# Patient Record
Sex: Female | Born: 1940 | Race: White | Hispanic: No | Smoking: Never smoker
Health system: Southern US, Community
[De-identification: ages and names within clinical notes are randomized; demographics above are authoritative.]

## PROBLEM LIST (undated history)

## (undated) DIAGNOSIS — G709 Myoneural disorder, unspecified: Secondary | ICD-10-CM

## (undated) DIAGNOSIS — I499 Cardiac arrhythmia, unspecified: Secondary | ICD-10-CM

## (undated) DIAGNOSIS — Z8489 Family history of other specified conditions: Secondary | ICD-10-CM

## (undated) DIAGNOSIS — Z9889 Other specified postprocedural states: Secondary | ICD-10-CM

## (undated) DIAGNOSIS — I10 Essential (primary) hypertension: Secondary | ICD-10-CM

## (undated) DIAGNOSIS — E039 Hypothyroidism, unspecified: Secondary | ICD-10-CM

## (undated) DIAGNOSIS — M1990 Unspecified osteoarthritis, unspecified site: Secondary | ICD-10-CM

## (undated) DIAGNOSIS — R7303 Prediabetes: Secondary | ICD-10-CM

## (undated) DIAGNOSIS — R112 Nausea with vomiting, unspecified: Secondary | ICD-10-CM

## (undated) HISTORY — PX: COLON RESECTION: SHX5231

## (undated) HISTORY — PX: CATARACT EXTRACTION: SUR2

## (undated) HISTORY — PX: OTHER SURGICAL HISTORY: SHX169

## (undated) HISTORY — PX: COLON SURGERY: SHX602

## (undated) HISTORY — PX: TUBAL LIGATION: SHX77

## (undated) HISTORY — PX: THYROIDECTOMY: SHX17

## (undated) HISTORY — PX: TONSILLECTOMY: SUR1361

## (undated) HISTORY — PX: CARPAL TUNNEL RELEASE: SHX101

---

## 2008-10-14 ENCOUNTER — Ambulatory Visit (HOSPITAL_BASED_OUTPATIENT_CLINIC_OR_DEPARTMENT_OTHER): Admission: RE | Admit: 2008-10-14 | Discharge: 2008-10-14 | Payer: Self-pay | Admitting: Orthopedic Surgery

## 2010-04-25 HISTORY — PX: COLON RESECTION: SHX5231

## 2010-08-02 LAB — POCT I-STAT 4, (NA,K, GLUC, HGB,HCT)
Glucose, Bld: 92 mg/dL (ref 70–99)
Sodium: 138 mEq/L (ref 135–145)

## 2010-09-07 NOTE — Op Note (Signed)
NAMELELA, MURFIN                 ACCOUNT NO.:  [id number]   MEDICAL RECORD NO.:  [id number]          PATIENT TYPE:  AMB   LOCATION:  [facility]                         FACILITY:  [facility]   PHYSICIAN:  Ollen Gross, M.D.    DATE OF BIRTH:  [birth year]   DATE OF PROCEDURE:  10/14/2008  DATE OF DISCHARGE:                               OPERATIVE REPORT   PREOPERATIVE DIAGNOSIS:  Right knee medial meniscal tear.   POSTOPERATIVE DIAGNOSIS:  Right knee medial meniscal tear, plus chondral  defect, medial femoral condyle.   PROCEDURES:  Right knee arthroscopy with medial meniscal debridement and  chondroplasty.   SURGEON:  Ollen Gross, M.D.   ASSISTANT:  None.   ANESTHESIA:  General.   ESTIMATED BLOOD LOSS:  Minimal.   DRAINS:  None.   COMPLICATIONS:  None.   CONDITION:  Stable to recovery.   BRIEF CLINICAL NOTE:  Ms. Slavick is a 70 year old female who has  significant right knee pain and mechanical symptoms.  Exam and history  suggested a medial meniscal tear, confirmed by MRI.  She presents now  for arthroscopy and debridement.   PROCEDURE IN DETAIL:  After successful administration of general  anesthetic, tourniquet was placed high on her right thigh and the right  lower extremity prepped and draped in the usual sterile fashion.  Standard superomedial and inferolateral portal incisions were made.  Inflow cannula passed superomedial and camera passed inferolateral.  Arthroscopic visualization proceeds.  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  The  trochlea looks fairly normal.  Medial and lateral gutters were  visualized.  There were no loose bodies.  Flexion and valgus force was  applied to the knee and the medial compartment centered.  There was  evidence of a significant tear in the body and posterior horn of the  medial meniscus.  There was also about a 1 x 1-cm area of unstable  cartilage on the medial femoral condyle.   The rest of the condyle looks  fairly normal.  A spinal needle was used to localize the inferomedial  portal.   A small incision was made, dilator placed, and then I debrided the  meniscus back to a stable base with baskets and a 4.2-mm shaver and  sealed it off with the ArthroCare device.  The meniscus was stabilized.  The shaver was then used to debride the unstable cartilage on the  surface of the medial femoral condyle.  This did not go all the way down  to bone.  I debrided back to a stable cartilaginous base with stable  edges.  Intercondylar notch was then visualized and the ACL was normal.  The lateral compartment was entered and it was normal.  I again  inspected the joint.  There were no other tears, loose bodies, or  defects noted.  Arthroscopic equipment was removed from the  inferior portals, which were closed with interrupted 4-0 nylon.  20 mL  of 0.25% Marcaine with epi are injected through the inflow cannula, then  that is removed and that portal closed  with nylon.  A bulky sterile  dressing is then applied and she is awakened and transported to recovery  in stable condition.      Ollen Gross, M.D.  Electronically Signed     FA/MEDQ  D:  10/14/2008  T:  10/15/2008  Job:  [id number]

## 2012-03-02 ENCOUNTER — Telehealth: Payer: Self-pay | Admitting: Oncology

## 2012-03-02 NOTE — Telephone Encounter (Signed)
The patient would like to see Dr. Darnelle Catalan because her friend is involved in pet therapy and has heard of him through that.  I scheduled her for [facility] 11/27 to see him.   Her son Thayer Ohm travels for her job and is not sure that he can attend that clinic.   She wants him to be able to call and find out info about her diagnosis and treatment plan so she has expressed permission for him to call me to answer questions.   She has my contact information should she or her son wants to communicate.

## 2012-03-06 ENCOUNTER — Telehealth: Payer: Self-pay | Admitting: Oncology

## 2012-03-06 NOTE — Telephone Encounter (Signed)
Previous documentation of phone call incorrectly documented on the wrong patient.   Please disregard.

## 2013-08-29 ENCOUNTER — Other Ambulatory Visit: Payer: Self-pay | Admitting: Endocrinology

## 2013-08-29 DIAGNOSIS — E041 Nontoxic single thyroid nodule: Secondary | ICD-10-CM

## 2013-09-03 ENCOUNTER — Other Ambulatory Visit: Payer: Self-pay

## 2013-09-03 ENCOUNTER — Ambulatory Visit
Admission: RE | Admit: 2013-09-03 | Discharge: 2013-09-03 | Disposition: A | Discharge status: Home / Self Care | Payer: Medicare Other | Source: Ambulatory Visit | Attending: Endocrinology | Admitting: Endocrinology

## 2013-09-03 ENCOUNTER — Other Ambulatory Visit (HOSPITAL_COMMUNITY)
Admission: RE | Admit: 2013-09-03 | Discharge: 2013-09-03 | Disposition: A | Discharge status: Home / Self Care | Payer: Medicare Other | Source: Ambulatory Visit | Attending: Interventional Radiology | Admitting: Interventional Radiology

## 2013-09-03 DIAGNOSIS — E041 Nontoxic single thyroid nodule: Secondary | ICD-10-CM | POA: Insufficient documentation

## 2013-09-04 ENCOUNTER — Other Ambulatory Visit: Payer: Self-pay

## 2013-09-26 ENCOUNTER — Other Ambulatory Visit: Payer: Self-pay | Admitting: Endocrinology

## 2013-09-26 DIAGNOSIS — E041 Nontoxic single thyroid nodule: Secondary | ICD-10-CM

## 2013-10-08 ENCOUNTER — Other Ambulatory Visit (HOSPITAL_COMMUNITY)
Admission: RE | Admit: 2013-10-08 | Discharge: 2013-10-08 | Disposition: A | Discharge status: Home / Self Care | Payer: Medicare Other | Source: Ambulatory Visit | Attending: Interventional Radiology | Admitting: Interventional Radiology

## 2013-10-08 ENCOUNTER — Ambulatory Visit
Admission: RE | Admit: 2013-10-08 | Discharge: 2013-10-08 | Disposition: A | Discharge status: Home / Self Care | Payer: Medicare Other | Source: Ambulatory Visit | Attending: Endocrinology | Admitting: Endocrinology

## 2013-10-08 DIAGNOSIS — E041 Nontoxic single thyroid nodule: Secondary | ICD-10-CM | POA: Insufficient documentation

## 2013-12-03 HISTORY — PX: THYROIDECTOMY: SHX17

## 2015-06-01 ENCOUNTER — Ambulatory Visit: Payer: Self-pay | Admitting: Orthopedic Surgery

## 2015-06-01 NOTE — Progress Notes (Signed)
Preoperative surgical orders have been place into the Epic hospital system for Kathryn [name] on 06/01/2015, 11:04 AM  by Patrica Duel for surgery on 07-01-15.  Preop Total Hip - Anterior Approach orders including IV Tylenol, and IV Decadron as long as there are no contraindications to the above medications. Avel Peace, PA-C

## 2015-06-22 NOTE — Patient Instructions (Signed)
Satomi Buda  06/22/2015   Your procedure is scheduled on: 07/01/2015    Report to [facility] Main  Entrance take [facility]  elevators to 3rd floor to  Short Stay Center at    245pm  Call this number if you have problems the morning of surgery [phone]   Remember: ONLY 1 PERSON MAY GO WITH YOU TO SHORT STAY TO GET  READY MORNING OF YOUR SURGERY.  Do not eat food after midnite,.  May have clear liquids from 12 midnite until 1000am then nothing by mouth.       Take these medicines the morning of surgery with A SIP OF WATER: Hydralazine ( Apresoline), Synthroid, Metoprolol ( Lopressor)                                 You may not have any metal on your body including hair pins and              piercings  Do not wear jewelry, make-up, lotions, powders or perfumes, deodorant             Do not wear nail polish.  Do not shave  48 hours prior to surgery.            Do not bring valuables to the hospital. [facility] IS NOT             RESPONSIBLE   FOR VALUABLES.  Contacts, dentures or bridgework may not be worn into surgery.  Leave suitcase in the car. After surgery it may be brought to your room.         Special Instructions: coughing and deep breathing exercises, leg exercises               Please read over the following fact sheets you were given: _____________________________________________________________________                CLEAR LIQUID DIET   Foods Allowed                                                                     Foods Excluded  Coffee and tea, regular and decaf                             liquids that you cannot  Plain Jell-O in any flavor                                             see through such as: Fruit ices (not with fruit pulp)                                     milk, soups, orange juice  Iced Popsicles  All solid food Carbonated beverages, regular and diet                                     Cranberry, grape and apple juices Sports drinks like Gatorade Lightly seasoned clear broth or consume(fat free) Sugar, honey syrup  Sample Menu Breakfast                                Lunch                                     Supper Cranberry juice                    Beef broth                            Chicken broth Jell-O                                     Grape juice                           Apple juice Coffee or tea                        Jell-O                                      Popsicle                                                Coffee or tea                        Coffee or tea  _____________________________________________________________________  [facility] - Preparing for Surgery Before surgery, you can play an important role.  Because skin is not sterile, your skin needs to be as free of germs as possible.  You can reduce the number of germs on your skin by washing with CHG (chlorahexidine gluconate) soap before surgery.  CHG is an antiseptic cleaner which kills germs and bonds with the skin to continue killing germs even after washing. Please DO NOT use if you have an allergy to CHG or antibacterial soaps.  If your skin becomes reddened/irritated stop using the CHG and inform your nurse when you arrive at Short Stay. Do not shave (including legs and underarms) for at least 48 hours prior to the first CHG shower.  You may shave your face/neck. Please follow these instructions carefully:  1.  Shower with CHG Soap the night before surgery and the  morning of Surgery.  2.  If you choose to wash your hair, wash your hair first as usual with your  normal  shampoo.  3.  After you shampoo, rinse your hair and body thoroughly to remove the  shampoo.  4.  Use CHG as you would any other liquid soap.  You can apply chg directly  to the skin and wash                       Gently with a scrungie or clean washcloth.  5.  Apply the CHG Soap to your body ONLY  FROM THE NECK DOWN.   Do not use on face/ open                           Wound or open sores. Avoid contact with eyes, ears mouth and genitals (private parts).                       Wash face,  Genitals (private parts) with your normal soap.             6.  Wash thoroughly, paying special attention to the area where your surgery  will be performed.  7.  Thoroughly rinse your body with warm water from the neck down.  8.  DO NOT shower/wash with your normal soap after using and rinsing off  the CHG Soap.                9.  Pat yourself dry with a clean towel.            10.  Wear clean pajamas.            11.  Place clean sheets on your bed the night of your first shower and do not  sleep with pets. Day of Surgery : Do not apply any lotions/deodorants the morning of surgery.  Please wear clean clothes to the hospital/surgery center.  FAILURE TO FOLLOW THESE INSTRUCTIONS MAY RESULT IN THE CANCELLATION OF YOUR SURGERY PATIENT SIGNATURE_________________________________  NURSE SIGNATURE__________________________________  ________________________________________________________________________  WHAT IS A BLOOD TRANSFUSION? Blood Transfusion Information  A transfusion is the replacement of blood or some of its parts. Blood is made up of multiple cells which provide different functions.  Red blood cells carry oxygen and are used for blood loss replacement.  White blood cells fight against infection.  Platelets control bleeding.  Plasma helps clot blood.  Other blood products are available for specialized needs, such as hemophilia or other clotting disorders. BEFORE THE TRANSFUSION  Who gives blood for transfusions?   Healthy volunteers who are fully evaluated to make sure their blood is safe. This is blood bank blood. Transfusion therapy is the safest it has ever been in the practice of medicine. Before blood is taken from a donor, a complete history is taken to make sure that person has  no history of diseases nor engages in risky social behavior (examples are intravenous drug use or sexual activity with multiple partners). The donor's travel history is screened to minimize risk of transmitting infections, such as malaria. The donated blood is tested for signs of infectious diseases, such as HIV and hepatitis. The blood is then tested to be sure it is compatible with you in order to minimize the chance of a transfusion reaction. If you or a relative donates blood, this is often done in anticipation of surgery and is not appropriate for emergency situations. It takes many days to process the donated blood. RISKS AND COMPLICATIONS Although transfusion therapy is very safe and saves many lives, the main dangers of transfusion include:  1. Getting an infectious disease. 2. Developing a transfusion reaction.  This is an allergic reaction to something in the blood you were given. Every precaution is taken to prevent this. The decision to have a blood transfusion has been considered carefully by your caregiver before blood is given. Blood is not given unless the benefits outweigh the risks. AFTER THE TRANSFUSION  Right after receiving a blood transfusion, you will usually feel much better and more energetic. This is especially true if your red blood cells have gotten low (anemic). The transfusion raises the level of the red blood cells which carry oxygen, and this usually causes an energy increase.  The nurse administering the transfusion will monitor you carefully for complications. HOME CARE INSTRUCTIONS  No special instructions are needed after a transfusion. You may find your energy is better. Speak with your caregiver about any limitations on activity for underlying diseases you may have. SEEK MEDICAL CARE IF:   Your condition is not improving after your transfusion.  You develop redness or irritation at the intravenous (IV) site. SEEK IMMEDIATE MEDICAL CARE IF:  Any of the following  symptoms occur over the next 12 hours:  Shaking chills.  You have a temperature by mouth above 102 F (38.9 C), not controlled by medicine.  Chest, back, or muscle pain.  People around you feel you are not acting correctly or are confused.  Shortness of breath or difficulty breathing.  Dizziness and fainting.  You get a rash or develop hives.  You have a decrease in urine output.  Your urine turns a dark color or changes to pink, red, or Deiter. Any of the following symptoms occur over the next 10 days:  You have a temperature by mouth above 102 F (38.9 C), not controlled by medicine.  Shortness of breath.  Weakness after normal activity.  The white part of the eye turns yellow (jaundice).  You have a decrease in the amount of urine or are urinating less often.  Your urine turns a dark color or changes to pink, red, or Gittens. Document Released: 04/08/2000 Document Revised: 07/04/2011 Document Reviewed: 11/26/2007 ExitCare Patient Information 2014 [place].  _______________________________________________________________________  Incentive Spirometer  An incentive spirometer is a tool that can help keep your lungs clear and active. This tool measures how well you are filling your lungs with each breath. Taking long deep breaths may help reverse or decrease the chance of developing breathing (pulmonary) problems (especially infection) following:  A long period of time when you are unable to move or be active. BEFORE THE PROCEDURE   If the spirometer includes an indicator to show your best effort, your nurse or respiratory therapist will set it to a desired goal.  If possible, sit up straight or lean slightly forward. Try not to slouch.  Hold the incentive spirometer in an upright position. INSTRUCTIONS FOR USE  3. Sit on the edge of your bed if possible, or sit up as far as you can in bed or on a chair. 4. Hold the incentive spirometer in an upright  position. 5. Breathe out normally. 6. Place the mouthpiece in your mouth and seal your lips tightly around it. 7. Breathe in slowly and as deeply as possible, raising the piston or the ball toward the top of the column. 8. Hold your breath for 3-5 seconds or for as long as possible. Allow the piston or ball to fall to the bottom of the column. 9. Remove the mouthpiece from your mouth and breathe out normally. 10. Rest for a few seconds and repeat Steps  1 through 7 at least 10 times every 1-2 hours when you are awake. Take your time and take a few normal breaths between deep breaths. 11. The spirometer may include an indicator to show your best effort. Use the indicator as a goal to work toward during each repetition. 12. After each set of 10 deep breaths, practice coughing to be sure your lungs are clear. If you have an incision (the cut made at the time of surgery), support your incision when coughing by placing a pillow or rolled up towels firmly against it. Once you are able to get out of bed, walk around indoors and cough well. You may stop using the incentive spirometer when instructed by your caregiver.  RISKS AND COMPLICATIONS  Take your time so you do not get dizzy or light-headed.  If you are in pain, you may need to take or ask for pain medication before doing incentive spirometry. It is harder to take a deep breath if you are having pain. AFTER USE  Rest and breathe slowly and easily.  It can be helpful to keep track of a log of your progress. Your caregiver can provide you with a simple table to help with this. If you are using the spirometer at home, follow these instructions: [place] IF:   You are having difficultly using the spirometer.  You have trouble using the spirometer as often as instructed.  Your pain medication is not giving enough relief while using the spirometer.  You develop fever of 100.5 F (38.1 C) or higher. SEEK IMMEDIATE MEDICAL CARE IF:    You cough up bloody sputum that had not been present before.  You develop fever of 102 F (38.9 C) or greater.  You develop worsening pain at or near the incision site. MAKE SURE YOU:   Understand these instructions.  Will watch your condition.  Will get help right away if you are not doing well or get worse. Document Released: 08/22/2006 Document Revised: 07/04/2011 Document Reviewed: 10/23/2006 [facility] Patient Information 2014 [place], [place].   ________________________________________________________________________

## 2015-06-23 ENCOUNTER — Encounter (HOSPITAL_COMMUNITY)
Admission: RE | Admit: 2015-06-23 | Discharge: 2015-06-23 | Disposition: A | Discharge status: Home / Self Care | Payer: Medicare Other | Source: Ambulatory Visit | Attending: Orthopedic Surgery | Admitting: Orthopedic Surgery

## 2015-06-23 ENCOUNTER — Encounter (HOSPITAL_COMMUNITY): Payer: Self-pay

## 2015-06-23 DIAGNOSIS — Z01818 Encounter for other preprocedural examination: Secondary | ICD-10-CM | POA: Insufficient documentation

## 2015-06-23 DIAGNOSIS — M1611 Unilateral primary osteoarthritis, right hip: Secondary | ICD-10-CM | POA: Diagnosis not present

## 2015-06-23 DIAGNOSIS — Z01812 Encounter for preprocedural laboratory examination: Secondary | ICD-10-CM | POA: Insufficient documentation

## 2015-06-23 DIAGNOSIS — R001 Bradycardia, unspecified: Secondary | ICD-10-CM | POA: Diagnosis not present

## 2015-06-23 DIAGNOSIS — Z0183 Encounter for blood typing: Secondary | ICD-10-CM | POA: Insufficient documentation

## 2015-06-23 DIAGNOSIS — R9431 Abnormal electrocardiogram [ECG] [EKG]: Secondary | ICD-10-CM | POA: Diagnosis not present

## 2015-06-23 HISTORY — DX: Family history of other specified conditions: Z84.89

## 2015-06-23 HISTORY — DX: Nausea with vomiting, unspecified: R11.2

## 2015-06-23 HISTORY — DX: Essential (primary) hypertension: I10

## 2015-06-23 HISTORY — DX: Nausea with vomiting, unspecified: Z98.890

## 2015-06-23 HISTORY — DX: Hypothyroidism, unspecified: E03.9

## 2015-06-23 HISTORY — DX: Unspecified osteoarthritis, unspecified site: M19.90

## 2015-06-23 LAB — URINALYSIS, ROUTINE W REFLEX MICROSCOPIC
BILIRUBIN URINE: NEGATIVE
Glucose, UA: NEGATIVE mg/dL
Hgb urine dipstick: NEGATIVE
KETONES UR: NEGATIVE mg/dL
LEUKOCYTES UA: NEGATIVE
NITRITE: NEGATIVE
PH: 7.5 (ref 5.0–8.0)
Protein, ur: NEGATIVE mg/dL
SPECIFIC GRAVITY, URINE: 1.006 (ref 1.005–1.030)

## 2015-06-23 LAB — ABO/RH: ABO/RH(D): O POS

## 2015-06-23 LAB — SURGICAL PCR SCREEN
MRSA, PCR: NEGATIVE
STAPHYLOCOCCUS AUREUS: NEGATIVE

## 2015-06-23 LAB — PROTIME-INR
INR: 1.07 (ref 0.00–1.49)
PROTHROMBIN TIME: 14.1 s (ref 11.6–15.2)

## 2015-06-23 LAB — APTT: APTT: 38 s — AB (ref 24–37)

## 2015-06-23 NOTE — Progress Notes (Signed)
Called [facility] spoke with wilissa and made aware patient had increased bloood pressure and ekg changes at pre,op today and will need to see pcp prior to 07-01-15 surgery per dr Acey Lav, she will page drew and dr Lequita Halt to call us back regarding this.

## 2015-06-23 NOTE — Progress Notes (Signed)
Spoke with Clinical Coordinator ( Anneita) at office after holding to speak with nurse or Dr Patsey Berthold at office for 20 minutes and informed her of EKG changes noted on EKG of 06/23/2015 since last EKG of 2010 and B/P readings from todays' preop visit that have been routed to Dr Patsey Berthold via [facility].  Informed Clinical Coordinator that Dr Acey Lav with anesthesiology here at [facility] wants patient to be seen prior to surgery on 07/01/15 for EKG changes- 06/23/2015.   Clinical Coordinator stated that she would inform Dr Patsey Berthold of above.  Dr Lequita Halt and Avel Peace, PA called from [facility] and informed them of above.

## 2015-06-23 NOTE — Progress Notes (Signed)
AT 145pm at end of preop appointment attempted to call Dr Patsey Berthold and was on hold x 15 minutes.  Routed blood pressure information via EPIC to Dr Patsey Berthold.  Patient aware.  Patient voices no complaints.

## 2015-06-23 NOTE — Progress Notes (Signed)
Dr Acey Lav made aware of EKG done 06/23/2015 ( final) compared to EKG done in 2010 faxed over by EKG dept at [facility].  Dr Acey Lav aware of hypertension history and blood pressure readings at today's preop appointment.  Dr Acey Lav stated patient needs to be seen again by PCP prior to surgery on 07/01/2015 due to EKG changes.  Called office of Dr Lequita Halt and asked to speak to [facility] or Dr Lequita Halt regarding this patient. They are to call me back.

## 2015-06-23 NOTE — Progress Notes (Signed)
Clearance- Dr Patsey Berthold- 06/19/2015 on chart with LOV 06/19/15.  Requested CMP, CBC done 06/19/2015 from office of Dr Patsey Berthold.

## 2015-06-23 NOTE — Progress Notes (Addendum)
Called office of Dr Patsey Berthold to report blood pressure readings at preop appointment.  Gave blood pressure readings to office and they have my name and phone number with information regarding blood pressure readings .

## 2015-06-23 NOTE — Progress Notes (Signed)
Patient had preop appointment for upcoming surgery on 07/01/2015.  Blood pressures were as follows: 1300pm-211/104 initially right arm  Upon recheck blood pressure in right arm was 196/82 and in left arm was 178/79 at 1335pm.

## 2015-06-24 NOTE — Progress Notes (Signed)
Verified with [facility] in [place]908 [phone] the dosage on the Triamterene-HCTZ patient is taking.  Patient was unsure of dosage at time of preop appt on 06/23/15.  Dosage is for Triamterene-HCTZ-37.5-25mg  .

## 2015-06-24 NOTE — Progress Notes (Signed)
Labs from PCP done 06/19/15 on chart to include: CMP, CBC and HGA1C.

## 2015-06-26 NOTE — Progress Notes (Signed)
Velvet McBride called from [facility] and left message with The Timken CompanyLezlie Suits RN wanting me to call her in reference to clearance of patient prior to surgery .  I left her a detailed message with the information related to B/P readings from 06/23/15 and EKG changes on EKG from 06/23/15 and that Dr Acey Lavarignan( anesthesia) wanted patient to be seen by PCP related to B/P elevation and EKG changes at preop visit on 06/23/2015.

## 2015-06-29 NOTE — Progress Notes (Signed)
Velvet Adin HectorMcBride called back earlier today and stated that both her and the Patient Coordinator Tree surgeon/Case Manager at [facility] are aware that the office visit [facility]2015 [facility] does not address EKG only B/p issues.  Both of them have spoke with Clinical coordinator at [facility] regarding patient needs abnormal EKG addressed from preop visit per anesthesia.  Dr Lequita HaltAluisio, per Meredith LeedsVelvet McBride is aware of all of above.

## 2015-06-29 NOTE — Progress Notes (Signed)
Called and left message on voice mail of Kathryn [name] that I had received note from 06/26/2015 from [facility] ( Dr Patsey BertholdEverly ) regarding followuop on hypertensive issues but not mention in note regarding followup on abnormal EKG done at preop .  Asked for Kathryn [name] to call me back regarding this issue in case she is aware of pending followup regarding EKG.

## 2015-06-29 NOTE — Progress Notes (Signed)
Received and placed on chart LOV from PCP dated 06/26/2015.

## 2015-06-30 ENCOUNTER — Ambulatory Visit: Payer: Self-pay | Admitting: Orthopedic Surgery

## 2015-06-30 NOTE — Progress Notes (Signed)
Patient made aware of time change on 06/30/2015.  Patient to be here at 200pm on 07/01/2015 and aware may have clear liquids untll 0900am then nothing by mouth.

## 2015-06-30 NOTE — H&P (Signed)
Kathryn [name] DOB: [birth year] Widowed / Language: [place] / Race: White Female Date of Admission:  07/01/2015 CC:  Right Hip Pain History of Present Illness The patient is a 75 year old female who comes in for a preoperative History and Physical. The patient is scheduled for a right total hip arthroplasty (anterior) to be performed by Dr. Gus Rankin. Aluisio, MD at [facility] on 07-01-2015. The patient is a 75 year old female who presented for follow up of their hip. The patient is being followed for their right hip pain and osteoarthritis. They are several months out from intra-articular injection. Symptoms reported include: pain, aching, pain with weightbearing and difficulty ambulating. The patient feels that they are doing poorly. The following medication has been used for pain control: tramadol. The patient has not gotten any relief of their symptoms with Cortisone injections (The injection did take away some of the referred pain in her knee, but did not really help with her hip pain). She states the hip is getting progressively worse over time. The intraarticular injection did not provide much benefit. She still has considerable pain in her hip and still has significantly limited function. She is at a stage now where she feels as though the hip is going to need to be fixed. She is ready to proceed with hip surgery at this time. They have been treated conservatively in the past for the above stated problem and despite conservative measures, they continue to have progressive pain and severe functional limitations and dysfunction. They have failed non-operative management including home exercise, medications, and injections. It is felt that they would benefit from undergoing total joint replacement. Risks and benefits of the procedure have been discussed with the patient and they elect to proceed with surgery. There are no active contraindications to surgery such as ongoing infection or rapidly  progressive neurological disease.  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 HydrALAZINE HCl (  Tablet, Oral twice a day) Active. Triamterene-HCTZ (25-50MG  Capsule, Oral daily) Active. Levothyroxine Sodium ( Tablet, Oral daily) Active. Metamucil (twice a day) Active. Fiber-con (twice a day) Active. Stool Softner (twice a day) Active. Advil Prn Active. Fish Oil Active. Red Yeast Rice Active. Calcium Active.  Past Surgical History  Arthroscopy of Knee  right Carpal Tunnel Repair  bilateral; 2004, 2008 Colectomy  partial Colon Polyp Removal - Colonoscopy  Thyroidectomy; Total  Tonsillectomy  Tubal Ligation    Review of System General  Present- Night Sweats. Not Present- Chills, Fatigue,  Fever, Memory Loss, Weight Gain and Weight Loss. Skin Not Present- Eczema, Hives, Itching, Lesions and Rash. HEENT Present- Tinnitus. Not Present- Dentures, Double Vision, Headache, Hearing Loss and Visual Loss. Respiratory Not Present- Allergies, Chronic Cough, Coughing up blood, Shortness of breath at rest and Shortness of breath with exertion. Cardiovascular Not Present- Chest Pain, Difficulty Breathing Lying Down, Murmur, Palpitations, Racing/skipping heartbeats and Swelling. Gastrointestinal Not Present- Abdominal Pain, Bloody Stool, Constipation, Diarrhea, Difficulty Swallowing, Heartburn, Jaundice, Loss of appetitie, Nausea and Vomiting. Female Genitourinary Not Present- Blood in Urine, Discharge, Flank Pain, Incontinence, Painful Urination, Urgency, Urinary frequency, Urinary Retention, Urinating at Night and Weak urinary stream. Musculoskeletal Present- Back Pain, Joint Pain, Joint Swelling and Morning Stiffness. Not Present- Muscle Pain, Muscle Weakness and Spasms. Neurological Not Present- Blackout spells, Difficulty with balance, Dizziness, Paralysis, Tremor and Weakness. Psychiatric Not Present- Insomnia.  Vitals  Weight: 126 lb Height: 61in Weight was reported by patient. Height was reported by patient. Body Surface Area: 1.55 m Body Mass Index: 23.81 kg/m  BP: 164/86 (Sitting, Right Arm, Standard)   Physical Exam  General Mental Status -Alert, cooperative and good historian. General Appearance-pleasant, Not in acute distress. Orientation-Oriented X3. Build & Nutrition-Petite, Well nourished and Well developed.  Head and Neck Head-normocephalic, atraumatic . Neck Global Assessment - supple, no bruit auscultated on the right, no bruit auscultated on the left.  Eye Pupil - Bilateral-Regular and Round. Motion - Bilateral-EOMI.  Chest and Lung Exam Auscultation Breath sounds - clear at anterior chest wall and clear at posterior chest wall.  Adventitious sounds - No Adventitious sounds.  Cardiovascular Auscultation Rhythm - Regular rate and rhythm. Heart Sounds - S1 WNL and S2 WNL. Murmurs & Other Heart Sounds - Auscultation of the heart reveals - No Murmurs.  Abdomen Palpation/Percussion Tenderness - Abdomen is non-tender to palpation. Rigidity (guarding) - Abdomen is soft. Auscultation Auscultation of the abdomen reveals - Bowel sounds normal.  Female Genitourinary Note: Not done, not pertinent to present illness   Musculoskeletal Note: On exam, she is alert and oriented, in no apparent distress. Her left hip shows pain free range of motion with normal motion. Right hip flexion about 100, minimal internal rotation, about 20 external rotation, 20 abduction with pain. She is tender in the right great trochanter.  RADIOGRAPHS Review of her radiographs AP pelvis and lateral of the right hip show bone-on-bone arthritis in the right hip with subchondral cystic formation.  Assessment & Plan  Primary osteoarthritis of right hip (M16.11)  Note:Surgical Plans: Right Total Hip Replacement - Anterior Approach  Disposition: Home  PCP: Dr. Patsey BertholdEverly  IV TXA  Anesthesia Issues: Nausea with Anesthesia  Signed electronically by Beckey RutterAlezandrew L Perkins, III PA-C

## 2015-06-30 NOTE — Progress Notes (Addendum)
Received and placed on chart EKG done March 2017at PCP office .   - Per Meredith LeedsVelvet McBride per Cain SieveSharon Shoffner , RN who received phone call from [facility]-new  [facility].

## 2015-07-01 ENCOUNTER — Encounter (HOSPITAL_COMMUNITY): Payer: Self-pay | Admitting: *Deleted

## 2015-07-01 ENCOUNTER — Inpatient Hospital Stay (HOSPITAL_COMMUNITY): Payer: Medicare Other

## 2015-07-01 ENCOUNTER — Inpatient Hospital Stay (HOSPITAL_COMMUNITY)
Admission: RE | Admit: 2015-07-01 | Discharge: 2015-07-03 | DRG: 470 | Disposition: A | Discharge status: Home Health | Payer: Medicare Other | Source: Ambulatory Visit | Attending: Orthopedic Surgery | Admitting: Orthopedic Surgery

## 2015-07-01 ENCOUNTER — Encounter (HOSPITAL_COMMUNITY)
Admission: RE | Disposition: A | Discharge status: Home Health | Payer: Self-pay | Source: Ambulatory Visit | Attending: Orthopedic Surgery

## 2015-07-01 ENCOUNTER — Inpatient Hospital Stay (HOSPITAL_COMMUNITY): Payer: Medicare Other | Admitting: Anesthesiology

## 2015-07-01 DIAGNOSIS — I10 Essential (primary) hypertension: Secondary | ICD-10-CM | POA: Diagnosis present

## 2015-07-01 DIAGNOSIS — H9193 Unspecified hearing loss, bilateral: Secondary | ICD-10-CM | POA: Diagnosis present

## 2015-07-01 DIAGNOSIS — M1711 Unilateral primary osteoarthritis, right knee: Secondary | ICD-10-CM | POA: Diagnosis present

## 2015-07-01 DIAGNOSIS — E039 Hypothyroidism, unspecified: Secondary | ICD-10-CM | POA: Diagnosis present

## 2015-07-01 DIAGNOSIS — Z96649 Presence of unspecified artificial hip joint: Secondary | ICD-10-CM

## 2015-07-01 DIAGNOSIS — M1611 Unilateral primary osteoarthritis, right hip: Principal | ICD-10-CM | POA: Diagnosis present

## 2015-07-01 DIAGNOSIS — Z79899 Other long term (current) drug therapy: Secondary | ICD-10-CM | POA: Diagnosis not present

## 2015-07-01 DIAGNOSIS — M169 Osteoarthritis of hip, unspecified: Secondary | ICD-10-CM | POA: Diagnosis present

## 2015-07-01 HISTORY — PX: TOTAL HIP ARTHROPLASTY: SHX124

## 2015-07-01 LAB — TYPE AND SCREEN
ABO/RH(D): O POS
ANTIBODY SCREEN: NEGATIVE

## 2015-07-01 SURGERY — ARTHROPLASTY, HIP, TOTAL, ANTERIOR APPROACH
Anesthesia: Monitor Anesthesia Care | Site: Hip | Laterality: Right

## 2015-07-01 MED ORDER — SODIUM CHLORIDE 0.9 % IV SOLN: INTRAVENOUS | Status: DC

## 2015-07-01 MED ORDER — ONDANSETRON HCL 4 MG/2ML IJ SOLN
INTRAMUSCULAR | Status: DC | PRN
  Administered 2015-07-01: 4 mg via INTRAVENOUS

## 2015-07-01 MED ORDER — METOCLOPRAMIDE HCL 10 MG PO TABS: 5.0000 mg | ORAL_TABLET | Freq: Three times a day (TID) | ORAL | Status: DC | PRN

## 2015-07-01 MED ORDER — ACETAMINOPHEN 10 MG/ML IV SOLN
INTRAVENOUS | Status: AC
Start: 2015-07-01 — End: 2015-07-01
  Filled 2015-07-01: qty 100

## 2015-07-01 MED ORDER — SODIUM CHLORIDE 0.9 % IV SOLN
INTRAVENOUS | Status: DC
  Administered 2015-07-01: via INTRAVENOUS

## 2015-07-01 MED ORDER — DOCUSATE SODIUM 100 MG PO CAPS
100.0000 mg | ORAL_CAPSULE | Freq: Two times a day (BID) | ORAL | Status: DC
  Administered 2015-07-01 – 2015-07-03 (×4): 100 mg via ORAL

## 2015-07-01 MED ORDER — ONDANSETRON HCL 4 MG/2ML IJ SOLN
INTRAMUSCULAR | Status: AC
  Filled 2015-07-01: qty 2

## 2015-07-01 MED ORDER — RIVAROXABAN 10 MG PO TABS
10.0000 mg | ORAL_TABLET | Freq: Every day | ORAL | Status: DC
  Administered 2015-07-02 – 2015-07-03 (×2): 10 mg via ORAL
  Filled 2015-07-01 (×3): qty 1

## 2015-07-01 MED ORDER — ACETAMINOPHEN 10 MG/ML IV SOLN
1000.0000 mg | Freq: Once | INTRAVENOUS | Status: AC
  Administered 2015-07-01: 1000 mg via INTRAVENOUS
  Filled 2015-07-01: qty 100

## 2015-07-01 MED ORDER — BISACODYL 10 MG RE SUPP: 10.0000 mg | Freq: Every day | RECTAL | Status: DC | PRN

## 2015-07-01 MED ORDER — CEFAZOLIN SODIUM-DEXTROSE 2-3 GM-% IV SOLR
INTRAVENOUS | Status: AC
  Filled 2015-07-01: qty 50

## 2015-07-01 MED ORDER — ONDANSETRON HCL 4 MG PO TABS: 4.0000 mg | ORAL_TABLET | Freq: Four times a day (QID) | ORAL | Status: DC | PRN

## 2015-07-01 MED ORDER — ACETAMINOPHEN 500 MG PO TABS
1000.0000 mg | ORAL_TABLET | Freq: Four times a day (QID) | ORAL | Status: AC
  Administered 2015-07-01 – 2015-07-02 (×4): 1000 mg via ORAL
  Filled 2015-07-01 (×4): qty 2

## 2015-07-01 MED ORDER — FLEET ENEMA 7-19 GM/118ML RE ENEM: 1.0000 | ENEMA | Freq: Once | RECTAL | Status: DC | PRN

## 2015-07-01 MED ORDER — PROPOFOL 10 MG/ML IV BOLUS
INTRAVENOUS | Status: AC
  Filled 2015-07-01: qty 20

## 2015-07-01 MED ORDER — PROPOFOL 10 MG/ML IV BOLUS
INTRAVENOUS | Status: AC
  Filled 2015-07-01: qty 40

## 2015-07-01 MED ORDER — METOCLOPRAMIDE HCL 5 MG/ML IJ SOLN: 5.0000 mg | Freq: Three times a day (TID) | INTRAMUSCULAR | Status: DC | PRN

## 2015-07-01 MED ORDER — DEXAMETHASONE SODIUM PHOSPHATE 10 MG/ML IJ SOLN: 10.0000 mg | Freq: Once | INTRAMUSCULAR | Status: DC

## 2015-07-01 MED ORDER — EPHEDRINE SULFATE 50 MG/ML IJ SOLN
INTRAMUSCULAR | Status: AC
  Filled 2015-07-01: qty 1

## 2015-07-01 MED ORDER — ACETAMINOPHEN 325 MG PO TABS: 650.0000 mg | ORAL_TABLET | Freq: Four times a day (QID) | ORAL | Status: DC | PRN

## 2015-07-01 MED ORDER — ACETAMINOPHEN 650 MG RE SUPP: 650.0000 mg | Freq: Four times a day (QID) | RECTAL | Status: DC | PRN

## 2015-07-01 MED ORDER — METHOCARBAMOL 1000 MG/10ML IJ SOLN
500.0000 mg | Freq: Four times a day (QID) | INTRAVENOUS | Status: DC | PRN
  Administered 2015-07-01: 500 mg via INTRAVENOUS
  Filled 2015-07-01 (×3): qty 5

## 2015-07-01 MED ORDER — BUPIVACAINE HCL (PF) 0.5 % IJ SOLN
INTRAMUSCULAR | Status: DC | PRN
  Administered 2015-07-01: 15 mg via INTRATHECAL

## 2015-07-01 MED ORDER — MORPHINE SULFATE (PF) 2 MG/ML IV SOLN
1.0000 mg | INTRAVENOUS | Status: DC | PRN
Start: 2015-07-01 — End: 2015-07-02
  Administered 2015-07-02: 1 mg via INTRAVENOUS
  Filled 2015-07-01: qty 1

## 2015-07-01 MED ORDER — CLONAZEPAM 0.5 MG PO TABS
0.5000 mg | ORAL_TABLET | Freq: Every evening | ORAL | Status: DC | PRN
  Administered 2015-07-02 (×2): 0.5 mg via ORAL
  Filled 2015-07-01 (×2): qty 1

## 2015-07-01 MED ORDER — DEXAMETHASONE SODIUM PHOSPHATE 10 MG/ML IJ SOLN
10.0000 mg | Freq: Once | INTRAMUSCULAR | Status: AC
  Administered 2015-07-02: 10 mg via INTRAVENOUS
  Filled 2015-07-01: qty 1

## 2015-07-01 MED ORDER — TRANEXAMIC ACID 1000 MG/10ML IV SOLN
1000.0000 mg | Freq: Once | INTRAVENOUS | Status: AC
  Administered 2015-07-01: 1000 mg via INTRAVENOUS
  Filled 2015-07-01: qty 10

## 2015-07-01 MED ORDER — BUPIVACAINE HCL (PF) 0.5 % IJ SOLN
INTRAMUSCULAR | Status: AC
  Filled 2015-07-01: qty 30

## 2015-07-01 MED ORDER — MIDAZOLAM HCL 2 MG/2ML IJ SOLN
INTRAMUSCULAR | Status: AC
  Filled 2015-07-01: qty 2

## 2015-07-01 MED ORDER — PROPOFOL 500 MG/50ML IV EMUL
INTRAVENOUS | Status: DC | PRN
  Administered 2015-07-01: 100 ug/kg/min via INTRAVENOUS

## 2015-07-01 MED ORDER — TRANEXAMIC ACID 1000 MG/10ML IV SOLN
1000.0000 mg | INTRAVENOUS | Status: AC
  Administered 2015-07-01: 1000 mg via INTRAVENOUS
  Filled 2015-07-01: qty 10

## 2015-07-01 MED ORDER — ONDANSETRON HCL 4 MG/2ML IJ SOLN: 4.0000 mg | Freq: Once | INTRAMUSCULAR | Status: DC | PRN

## 2015-07-01 MED ORDER — CEFAZOLIN SODIUM-DEXTROSE 2-3 GM-% IV SOLR
2.0000 g | Freq: Four times a day (QID) | INTRAVENOUS | Status: AC
  Administered 2015-07-01 – 2015-07-02 (×2): 2 g via INTRAVENOUS
  Filled 2015-07-01 (×2): qty 50

## 2015-07-01 MED ORDER — CHLORHEXIDINE GLUCONATE 4 % EX LIQD: 60.0000 mL | Freq: Once | CUTANEOUS | Status: DC

## 2015-07-01 MED ORDER — OXYCODONE HCL 5 MG PO TABS
5.0000 mg | ORAL_TABLET | ORAL | Status: DC | PRN
  Administered 2015-07-01 – 2015-07-02 (×2): 5 mg via ORAL
  Administered 2015-07-02: 10 mg via ORAL
  Filled 2015-07-01: qty 2
  Filled 2015-07-01 (×2): qty 1

## 2015-07-01 MED ORDER — METOPROLOL TARTRATE 50 MG PO TABS
50.0000 mg | ORAL_TABLET | Freq: Two times a day (BID) | ORAL | Status: DC
  Administered 2015-07-01 – 2015-07-03 (×4): 50 mg via ORAL
  Filled 2015-07-01 (×5): qty 1

## 2015-07-01 MED ORDER — HYDRALAZINE HCL 50 MG PO TABS
50.0000 mg | ORAL_TABLET | Freq: Three times a day (TID) | ORAL | Status: DC
  Administered 2015-07-01 – 2015-07-03 (×5): 50 mg via ORAL
  Filled 2015-07-01 (×7): qty 1

## 2015-07-01 MED ORDER — LACTATED RINGERS IV SOLN
INTRAVENOUS | Status: DC | PRN
  Administered 2015-07-01 (×3): via INTRAVENOUS

## 2015-07-01 MED ORDER — DIPHENHYDRAMINE HCL 12.5 MG/5ML PO ELIX: 12.5000 mg | ORAL_SOLUTION | ORAL | Status: DC | PRN

## 2015-07-01 MED ORDER — FENTANYL CITRATE (PF) 100 MCG/2ML IJ SOLN
25.0000 ug | INTRAMUSCULAR | Status: DC | PRN
  Administered 2015-07-01: 50 ug via INTRAVENOUS
  Administered 2015-07-01 (×2): 25 ug via INTRAVENOUS

## 2015-07-01 MED ORDER — TRAMADOL HCL 50 MG PO TABS
50.0000 mg | ORAL_TABLET | Freq: Four times a day (QID) | ORAL | Status: DC | PRN
  Administered 2015-07-02: 100 mg via ORAL
  Filled 2015-07-01: qty 2

## 2015-07-01 MED ORDER — FENTANYL CITRATE (PF) 100 MCG/2ML IJ SOLN
INTRAMUSCULAR | Status: AC
  Filled 2015-07-01: qty 2

## 2015-07-01 MED ORDER — MIDAZOLAM HCL 5 MG/5ML IJ SOLN
INTRAMUSCULAR | Status: DC | PRN
  Administered 2015-07-01: 2 mg via INTRAVENOUS

## 2015-07-01 MED ORDER — CEFAZOLIN SODIUM-DEXTROSE 2-3 GM-% IV SOLR
2.0000 g | INTRAVENOUS | Status: AC
  Administered 2015-07-01: 2 g via INTRAVENOUS

## 2015-07-01 MED ORDER — BUPIVACAINE HCL (PF) 0.25 % IJ SOLN
INTRAMUSCULAR | Status: DC | PRN
  Administered 2015-07-01: 30 mL

## 2015-07-01 MED ORDER — LOSARTAN POTASSIUM 50 MG PO TABS
100.0000 mg | ORAL_TABLET | Freq: Every day | ORAL | Status: DC
  Administered 2015-07-02 – 2015-07-03 (×2): 100 mg via ORAL
  Filled 2015-07-01 (×2): qty 2

## 2015-07-01 MED ORDER — DEXAMETHASONE SODIUM PHOSPHATE 10 MG/ML IJ SOLN
INTRAMUSCULAR | Status: DC | PRN
  Administered 2015-07-01: 10 mg via INTRAVENOUS

## 2015-07-01 MED ORDER — LEVOTHYROXINE SODIUM 88 MCG PO TABS
88.0000 ug | ORAL_TABLET | Freq: Every day | ORAL | Status: DC
  Administered 2015-07-02 – 2015-07-03 (×2): 88 ug via ORAL
  Filled 2015-07-01 (×3): qty 1

## 2015-07-01 MED ORDER — POLYETHYLENE GLYCOL 3350 17 G PO PACK: 17.0000 g | PACK | Freq: Every day | ORAL | Status: DC | PRN

## 2015-07-01 MED ORDER — TRIAMTERENE-HCTZ 37.5-25 MG PO TABS
1.0000 | ORAL_TABLET | Freq: Every morning | ORAL | Status: DC
  Administered 2015-07-02 – 2015-07-03 (×2): 1 via ORAL
  Filled 2015-07-01 (×2): qty 1

## 2015-07-01 MED ORDER — ONDANSETRON HCL 4 MG/2ML IJ SOLN
4.0000 mg | Freq: Four times a day (QID) | INTRAMUSCULAR | Status: DC | PRN
  Administered 2015-07-01 – 2015-07-02 (×3): 4 mg via INTRAVENOUS
  Filled 2015-07-01 (×2): qty 2

## 2015-07-01 MED ORDER — BUPIVACAINE HCL (PF) 0.25 % IJ SOLN
INTRAMUSCULAR | Status: AC
  Filled 2015-07-01: qty 30

## 2015-07-01 MED ORDER — METHOCARBAMOL 500 MG PO TABS
500.0000 mg | ORAL_TABLET | Freq: Four times a day (QID) | ORAL | Status: DC | PRN
  Administered 2015-07-02: 500 mg via ORAL
  Filled 2015-07-01: qty 1

## 2015-07-01 MED ORDER — DEXAMETHASONE SODIUM PHOSPHATE 10 MG/ML IJ SOLN
INTRAMUSCULAR | Status: AC
  Filled 2015-07-01: qty 1

## 2015-07-01 MED ORDER — PHENOL 1.4 % MT LIQD: 1.0000 | OROMUCOSAL | Status: DC | PRN

## 2015-07-01 MED ORDER — MENTHOL 3 MG MT LOZG: 1.0000 | LOZENGE | OROMUCOSAL | Status: DC | PRN

## 2015-07-01 MED ORDER — EPHEDRINE SULFATE 50 MG/ML IJ SOLN
INTRAMUSCULAR | Status: DC | PRN
  Administered 2015-07-01 (×2): 5 mg via INTRAVENOUS

## 2015-07-01 MED ORDER — FENTANYL CITRATE (PF) 100 MCG/2ML IJ SOLN
INTRAMUSCULAR | Status: DC | PRN
  Administered 2015-07-01: 100 ug via INTRAVENOUS

## 2015-07-01 SURGICAL SUPPLY — 36 items
BAG DECANTER FOR FLEXI CONT (MISCELLANEOUS) ×3 IMPLANT
BAG ZIPLOCK 12X15 (MISCELLANEOUS) IMPLANT
BLADE SAG 18X100X1.27 (BLADE) ×3 IMPLANT
CAPT HIP TOTAL 2 ×3 IMPLANT
CLOSURE WOUND 1/2 X4 (GAUZE/BANDAGES/DRESSINGS) ×1
CLOTH BEACON ORANGE TIMEOUT ST (SAFETY) ×3 IMPLANT
COVER PERINEAL POST (MISCELLANEOUS) ×3 IMPLANT
DECANTER SPIKE VIAL GLASS SM (MISCELLANEOUS) ×3 IMPLANT
DRAPE STERI IOBAN 125X83 (DRAPES) ×3 IMPLANT
DRAPE U-SHAPE 47X51 STRL (DRAPES) ×6 IMPLANT
DRSG ADAPTIC 3X8 NADH LF (GAUZE/BANDAGES/DRESSINGS) ×3 IMPLANT
DRSG MEPILEX BORDER 4X4 (GAUZE/BANDAGES/DRESSINGS) ×3 IMPLANT
DRSG MEPILEX BORDER 4X8 (GAUZE/BANDAGES/DRESSINGS) ×3 IMPLANT
DURAPREP 26ML APPLICATOR (WOUND CARE) ×3 IMPLANT
ELECT REM PT RETURN 9FT ADLT (ELECTROSURGICAL) ×3
ELECTRODE REM PT RTRN 9FT ADLT (ELECTROSURGICAL) ×1 IMPLANT
EVACUATOR 1/8 PVC DRAIN (DRAIN) ×3 IMPLANT
GLOVE BIO SURGEON STRL SZ7.5 (GLOVE) ×3 IMPLANT
GLOVE BIO SURGEON STRL SZ8 (GLOVE) ×6 IMPLANT
GLOVE BIOGEL PI IND STRL 8 (GLOVE) ×2 IMPLANT
GLOVE BIOGEL PI INDICATOR 8 (GLOVE) ×4
GOWN STRL REUS W/TWL LRG LVL3 (GOWN DISPOSABLE) ×3 IMPLANT
GOWN STRL REUS W/TWL XL LVL3 (GOWN DISPOSABLE) ×3 IMPLANT
NS IRRIG 1000ML POUR BTL (IV SOLUTION) ×3 IMPLANT
PACK ANTERIOR HIP CUSTOM (KITS) ×3 IMPLANT
STRIP CLOSURE SKIN 1/2X4 (GAUZE/BANDAGES/DRESSINGS) ×2 IMPLANT
SUT ETHIBOND NAB CT1 #1 30IN (SUTURE) ×3 IMPLANT
SUT MNCRL AB 4-0 PS2 18 (SUTURE) ×3 IMPLANT
SUT VIC AB 2-0 CT1 27 (SUTURE) ×4
SUT VIC AB 2-0 CT1 TAPERPNT 27 (SUTURE) ×2 IMPLANT
SUT VLOC 180 0 24IN GS25 (SUTURE) ×3 IMPLANT
SYR 50ML LL SCALE MARK (SYRINGE) IMPLANT
TRAY FOLEY W/METER SILVER 14FR (SET/KITS/TRAYS/PACK) ×3 IMPLANT
TRAY FOLEY W/METER SILVER 16FR (SET/KITS/TRAYS/PACK) IMPLANT
WATER STERILE IRR 1000ML POUR (IV SOLUTION) ×6 IMPLANT
YANKAUER SUCT BULB TIP 10FT TU (MISCELLANEOUS) ×3 IMPLANT

## 2015-07-01 NOTE — H&P (View-Only) (Signed)
Kathryn [name] DOB: 06/14/1940 Widowed / Language: English / Race: White Female Date of Admission:  07/01/2015 CC:  Right Hip Pain History of Present Illness The patient is a 74 year old female who comes in for a preoperative History and Physical. The patient is scheduled for a right total hip arthroplasty (anterior) to be performed by Dr. Frank V. Aluisio, MD at [facility] on 07-01-2015. The patient is a 74 year old female who presented for follow up of their hip. The patient is being followed for their right hip pain and osteoarthritis. They are several months out from intra-articular injection. Symptoms reported include: pain, aching, pain with weightbearing and difficulty ambulating. The patient feels that they are doing poorly. The following medication has been used for pain control: tramadol. The patient has not gotten any relief of their symptoms with Cortisone injections (The injection did take away some of the referred pain in her knee, but did not really help with her hip pain). She states the hip is getting progressively worse over time. The intraarticular injection did not provide much benefit. She still has considerable pain in her hip and still has significantly limited function. She is at a stage now where she feels as though the hip is going to need to be fixed. She is ready to proceed with hip surgery at this time. They have been treated conservatively in the past for the above stated problem and despite conservative measures, they continue to have progressive pain and severe functional limitations and dysfunction. They have failed non-operative management including home exercise, medications, and injections. It is felt that they would benefit from undergoing total joint replacement. Risks and benefits of the procedure have been discussed with the patient and they elect to proceed with surgery. There are no active contraindications to surgery such as ongoing infection or rapidly  progressive neurological disease.  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  Family History  Hypertension  mother Osteoarthritis  mother Cancer  father Heart Disease  father  Social History  Not under pain contract  No history of drug/alcohol rehab  Number of flights of stairs before winded  2-3 Tobacco use  01/23/2015: never smoker Tobacco / smoke exposure  01/23/2015: no Marital status  widowed Current drinker  01/23/2015: Currently drinks only occasionally per week Children  2 Current work status  retired Living situation  live alone Exercise  Exercises daily; does other Post-Surgical Plans  Home Advance Directives  Living Will, Healthcare POA  Medication History Metoprolol Tartrate (50MG Tablet, Oral twice a day) Active. HydrALAZINE HCl (50MG Tablet, Oral twice a day) Active. Triamterene-HCTZ (25-50MG Capsule, Oral daily) Active. Levothyroxine Sodium (88MCG Tablet, Oral daily) Active. Metamucil (twice a day) Active. Fiber-con (twice a day) Active. Stool Softner (twice a day) Active. Advil Prn Active. Fish Oil Active. Red Yeast Rice Active. Calcium Active.  Past Surgical History  Arthroscopy of Knee  right Carpal Tunnel Repair  bilateral; 2004, 2008 Colectomy  partial Colon Polyp Removal - Colonoscopy  Thyroidectomy; Total  Tonsillectomy  Tubal Ligation    Review of System General   Present- Night Sweats. Not Present- Chills, Fatigue,  Fever, Memory Loss, Weight Gain and Weight Loss. Skin Not Present- Eczema, Hives, Itching, Lesions and Rash. HEENT Present- Tinnitus. Not Present- Dentures, Double Vision, Headache, Hearing Loss and Visual Loss. Respiratory Not Present- Allergies, Chronic Cough, Coughing up blood, Shortness of breath at rest and Shortness of breath with exertion. Cardiovascular Not Present- Chest Pain, Difficulty Breathing Lying Down, Murmur, Palpitations, Racing/skipping heartbeats and Swelling. Gastrointestinal Not Present- Abdominal Pain, Bloody Stool, Constipation, Diarrhea, Difficulty Swallowing, Heartburn, Jaundice, Loss of appetitie, Nausea and Vomiting. Female Genitourinary Not Present- Blood in Urine, Discharge, Flank Pain, Incontinence, Painful Urination, Urgency, Urinary frequency, Urinary Retention, Urinating at Night and Weak urinary stream. Musculoskeletal Present- Back Pain, Joint Pain, Joint Swelling and Morning Stiffness. Not Present- Muscle Pain, Muscle Weakness and Spasms. Neurological Not Present- Blackout spells, Difficulty with balance, Dizziness, Paralysis, Tremor and Weakness. Psychiatric Not Present- Insomnia.  Vitals  Weight: 126 lb Height: 61in Weight was reported by patient. Height was reported by patient. Body Surface Area: 1.55 m Body Mass Index: 23.81 kg/m  BP: 164/86 (Sitting, Right Arm, Standard)   Physical Exam  General Mental Status -Alert, cooperative and good historian. General Appearance-pleasant, Not in acute distress. Orientation-Oriented X3. Build & Nutrition-Petite, Well nourished and Well developed.  Head and Neck Head-normocephalic, atraumatic . Neck Global Assessment - supple, no bruit auscultated on the right, no bruit auscultated on the left.  Eye Pupil - Bilateral-Regular and Round. Motion - Bilateral-EOMI.  Chest and Lung Exam Auscultation Breath sounds - clear at anterior chest wall and clear at posterior chest wall.  Adventitious sounds - No Adventitious sounds.  Cardiovascular Auscultation Rhythm - Regular rate and rhythm. Heart Sounds - S1 WNL and S2 WNL. Murmurs & Other Heart Sounds - Auscultation of the heart reveals - No Murmurs.  Abdomen Palpation/Percussion Tenderness - Abdomen is non-tender to palpation. Rigidity (guarding) - Abdomen is soft. Auscultation Auscultation of the abdomen reveals - Bowel sounds normal.  Female Genitourinary Note: Not done, not pertinent to present illness   Musculoskeletal Note: On exam, she is alert and oriented, in no apparent distress. Her left hip shows pain free range of motion with normal motion. Right hip flexion about 100, minimal internal rotation, about 20 external rotation, 20 abduction with pain. She is tender in the right great trochanter.  RADIOGRAPHS Review of her radiographs AP pelvis and lateral of the right hip show bone-on-bone arthritis in the right hip with subchondral cystic formation.  Assessment & Plan  Primary osteoarthritis of right hip (M16.11)  Note:Surgical Plans: Right Total Hip Replacement - Anterior Approach  Disposition: Home  PCP: Dr. Everly  IV TXA  Anesthesia Issues: Nausea with Anesthesia  Signed electronically by Alezandrew L Perkins, III PA-C 

## 2015-07-01 NOTE — Anesthesia Procedure Notes (Signed)
Spinal Patient location during procedure: OR Start time: 07/01/2015 3:48 PM End time: 07/01/2015 3:51 PM Staffing Resident/CRNA: Harle Stanford R Performed by: resident/CRNA  Preanesthetic Checklist Completed: patient identified, site marked, surgical consent, pre-op evaluation, timeout performed, IV checked, risks and benefits discussed and monitors and equipment checked Spinal Block Patient position: sitting Prep: DuraPrep Patient monitoring: heart rate, cardiac monitor, continuous pulse ox and blood pressure Approach: midline (spine is to the left of midline) Location: L3-4 Injection technique: single-shot Needle Needle type: Sprotte  Needle gauge: 25 G Needle length: 10 cm Needle insertion depth: 7 cm Assessment Sensory level: T6 Additional Notes Timeout spinal kit date checked SAB

## 2015-07-01 NOTE — Anesthesia Preprocedure Evaluation (Addendum)
Anesthesia Evaluation  Patient identified by MRN, date of birth, ID band Patient awake    Reviewed: Allergy & Precautions, NPO status , Patient's Chart, lab work & pertinent test results, reviewed documented beta blocker date and time   History of Anesthesia Complications (+) PONV and history of anesthetic complications  Airway Mallampati: I  TM Distance: >3 FB Neck ROM: Full    Dental  (+) Dental Advisory Given, Teeth Intact Upper and lower permanent bridges:   Pulmonary neg pulmonary ROS,    Pulmonary exam normal breath sounds clear to auscultation       Cardiovascular hypertension, Pt. on medications and Pt. on home beta blockers Normal cardiovascular exam Rhythm:Regular Rate:Normal     Neuro/Psych negative neurological ROS  negative psych ROS   GI/Hepatic negative GI ROS, Neg liver ROS,   Endo/Other  Hypothyroidism   Renal/GU negative Renal ROS     Musculoskeletal  (+) Arthritis , Osteoarthritis,    Abdominal   Peds  Hematology negative hematology ROS (+)   Anesthesia Other Findings Day of surgery medications reviewed with the patient.  Reproductive/Obstetrics                           Anesthesia Physical Anesthesia Plan  ASA: II  Anesthesia Plan: MAC and Spinal   Post-op Pain Management:    Induction: Intravenous  Airway Management Planned: Nasal Cannula  Additional Equipment:   Intra-op Plan:   Post-operative Plan:   Informed Consent: I have reviewed the patients History and Physical, chart, labs and discussed the procedure including the risks, benefits and alternatives for the proposed anesthesia with the patient or authorized representative who has indicated his/her understanding and acceptance.   Dental advisory given  Plan Discussed with: CRNA and Anesthesiologist  Anesthesia Plan Comments: (Discussed risks and benefits of and differences between spinal and  general. Discussed risks of spinal including headache, backache, failure, bleeding, infection, and nerve damage. Patient consents to spinal. Questions answered. Coagulation studies and platelet count acceptable.)       Anesthesia Quick Evaluation

## 2015-07-01 NOTE — Transfer of Care (Signed)
Immediate Anesthesia Transfer of Care Note  Patient: Kathryn MemosLinda [name]  Procedure(s) Performed: Procedure(s): RIGHT TOTAL HIP ARTHROPLASTY ANTERIOR APPROACH (Right)  Patient Location: PACU  Anesthesia Type:Spinal  Level of Consciousness: sedated  Airway & Oxygen Therapy: Patient Spontanous Breathing and Patient connected to face mask oxygen  Post-op Assessment: Report given to RN and Post -op Vital signs reviewed and stable  Post vital signs: Reviewed and stable  Last Vitals:  Filed Vitals:   07/01/15 1350 07/01/15 1434  BP: 187/75 185/71  Pulse: 62   Temp: 36.6 C   Resp: 18     Complications: No apparent anesthesia complications

## 2015-07-01 NOTE — Progress Notes (Signed)
Pt is at sensory level of S1, slightly bend at knees and is able to "rock" both legs back and forth. Dr Desmond Lopeurk made aware and he gave orders to discharge from PACU.

## 2015-07-01 NOTE — Interval H&P Note (Signed)
History and Physical Interval Note:  07/01/2015 3:01 PM  Kathryn MemosLinda [name]  has presented today for surgery, with the diagnosis of OSTEOARTHRITIS OF RIGHT HIP  The various methods of treatment have been discussed with the patient and family. After consideration of risks, benefits and other options for treatment, the patient has consented to  Procedure(s): RIGHT TOTAL HIP ARTHROPLASTY ANTERIOR APPROACH (Right) as a surgical intervention .  The patient's history has been reviewed, patient examined, no change in status, stable for surgery.  I have reviewed the patient's chart and labs.  Questions were answered to the patient's satisfaction.     Loanne DrillingALUISIO,Roshawn Lacina V

## 2015-07-01 NOTE — Op Note (Signed)
OPERATIVE REPORT  PREOPERATIVE DIAGNOSIS: Osteoarthritis of the Right hip.   POSTOPERATIVE DIAGNOSIS: Osteoarthritis of the Right  hip.   PROCEDURE: Right total hip arthroplasty, anterior approach.   SURGEON: Ollen Gross, MD   ASSISTANT: Sarita Bottom, PA-C  ANESTHESIA:  Spinal  ESTIMATED BLOOD LOSS:- 150 ml  DRAINS: Hemovac x1.   COMPLICATIONS: None   CONDITION: PACU - hemodynamically stable.   BRIEF CLINICAL NOTE: Kathryn [name] is a 75 y.o. female who has advanced end-  stage arthritis of her Right  hip with progressively worsening pain and  dysfunction.The patient has failed nonoperative management and presents for  total hip arthroplasty.   PROCEDURE IN DETAIL: After successful administration of spinal  anesthetic, the traction boots for the [facility] bed were placed on both  feet and the patient was placed onto the [facility] bed, boots placed into the leg  holders. The Right hip was then isolated from the perineum with plastic  drapes and prepped and draped in the usual sterile fashion. ASIS and  greater trochanter were marked and a oblique incision was made, starting  at about 1 cm lateral and 2 cm distal to the ASIS and coursing towards  the anterior cortex of the femur. The skin was cut with a 10 blade  through subcutaneous tissue to the level of the fascia overlying the  tensor fascia lata muscle. The fascia was then incised in line with the  incision at the junction of the anterior third and posterior 2/3rd. The  muscle was teased off the fascia and then the interval between the TFL  and the rectus was developed. The Hohmann retractor was then placed at  the top of the femoral neck over the capsule. The vessels overlying the  capsule were cauterized and the fat on top of the capsule was removed.  A Hohmann retractor was then placed anterior underneath the rectus  femoris to give exposure to the entire anterior capsule. A T-shaped  capsulotomy was performed. The  edges were tagged and the femoral head  was identified.       Osteophytes are removed off the superior acetabulum.  The femoral neck was then cut in situ with an oscillating saw. Traction  was then applied to the left lower extremity utilizing the [employer]  traction. The femoral head was then removed. Retractors were placed  around the acetabulum and then circumferential removal of the labrum was  performed. Osteophytes were also removed. Reaming starts at 43 mm to  medialize and  Increased in 2 mm increments to 47 mm. We reamed in  approximately 40 degrees of abduction, 20 degrees anteversion. A 48 mm  pinnacle acetabular shell was then impacted in anatomic position under  fluoroscopic guidance with excellent purchase. We did not need to place  any additional dome screws. A 28 mm neutral + 4 marathon liner was then  placed into the acetabular shell.       The femoral lift was then placed along the lateral aspect of the femur  just distal to the vastus ridge. The leg was  externally rotated and capsule  was stripped off the inferior aspect of the femoral neck down to the  level of the lesser trochanter, this was done with electrocautery. The femur was lifted after this was performed. The  leg was then placed and extended in adducted position to essentially delivering the femur. We also removed the capsule superiorly and the  piriformis from the piriformis fossa  to gain excellent exposure of the  proximal femur. Rongeur was used to remove some cancellous bone to get  into the lateral portion of the proximal femur for placement of the  initial starter reamer. The starter broaches was placed  the starter broach  and was shown to go down the center of the canal. Broaching  with the  Corail system was then performed starting at size 8, coursing  Up to size 9. A size 9 had excellent torsional and rotational  and axial stability. The trial high offset neck was then placed  with a 28 + 1.5 trial head.  The hip was then reduced. We confirmed that  the stem was in the canal both on AP and lateral x-rays. It also has excellent sizing. The hip was reduced with outstanding stability through full extension, full external rotation,  and then flexion in adduction internal rotation. AP pelvis was taken  and the leg lengths were measured and found to be exactly equal. Hip  was then dislocated again and the femoral head and neck removed. The  femoral broach was removed. Size 9 Corail stem with a high offset  neck was then impacted into the femur following native anteversion. Has  excellent purchase in the canal. Excellent torsional and rotational and  axial stability. It is confirmed to be in the canal on AP and lateral  fluoroscopic views. The 28 + 1.5 ceramic head was placed and the hip  reduced with outstanding stability. Again AP pelvis was taken and it  confirmed that the leg lengths were equal. The wound was then copiously  irrigated with saline solution and the capsule reattached and repaired  with Ethibond suture. 30 ml of .25% Bupivicaine injected into the capsule and into the edge of the tensor fascia lata as well as subcutaneous tissue. The fascia overlying the tensor fascia lata was  then closed with a running #1 V-Loc. Subcu was closed with interrupted  2-0 Vicryl and subcuticular running 4-0 Monocryl. Incision was cleaned  and dried. Steri-Strips and a bulky sterile dressing applied. Hemovac  drain was hooked to suction and then he was awakened and transported to  recovery in stable condition.        Please note that a surgical assistant was a medical necessity for this procedure to perform it in a safe and expeditious manner. Assistant was necessary to provide appropriate retraction of vital neurovascular structures and to prevent femoral fracture and allow for anatomic placement of the prosthesis.  Ollen GrossFrank Sohrab Keelan, M.D.

## 2015-07-02 ENCOUNTER — Encounter (HOSPITAL_COMMUNITY): Payer: Self-pay | Admitting: Orthopedic Surgery

## 2015-07-02 LAB — BASIC METABOLIC PANEL
Anion gap: 16 — ABNORMAL HIGH (ref 5–15)
BUN: 12 mg/dL (ref 6–20)
CALCIUM: 7.6 mg/dL — AB (ref 8.9–10.3)
CHLORIDE: 96 mmol/L — AB (ref 101–111)
CO2: 24 mmol/L (ref 22–32)
CREATININE: 0.64 mg/dL (ref 0.44–1.00)
GFR calc non Af Amer: >60 mL/min (ref >60)
GLUCOSE: 215 mg/dL — AB (ref 65–99)
Potassium: 3 mmol/L — ABNORMAL LOW (ref 3.5–5.1)
Sodium: 136 mmol/L (ref 135–145)

## 2015-07-02 LAB — CBC
HEMATOCRIT: 36.9 % (ref 36.0–46.0)
HEMOGLOBIN: 12.7 g/dL (ref 12.0–15.0)
MCH: 33.7 pg (ref 26.0–34.0)
MCHC: 34.4 g/dL (ref 30.0–36.0)
MCV: 97.9 fL (ref 78.0–100.0)
Platelets: 255 10*3/uL (ref 150–400)
RBC: 3.77 MIL/uL — ABNORMAL LOW (ref 3.87–5.11)
RDW: 11.6 % (ref 11.5–15.5)
WBC: 11.5 10*3/uL — ABNORMAL HIGH (ref 4.0–10.5)

## 2015-07-02 MED ORDER — OXYCODONE HCL 5 MG PO TABS
5.0000 mg | ORAL_TABLET | ORAL | Status: DC | PRN
  Administered 2015-07-02: 10 mg via ORAL
  Administered 2015-07-02 – 2015-07-03 (×2): 5 mg via ORAL
  Filled 2015-07-02 (×2): qty 1
  Filled 2015-07-02: qty 2

## 2015-07-02 MED ORDER — HYDROMORPHONE HCL 1 MG/ML IJ SOLN
1.0000 mg | INTRAMUSCULAR | Status: DC | PRN
  Administered 2015-07-02: 1 mg via INTRAVENOUS
  Filled 2015-07-02: qty 1

## 2015-07-02 MED ORDER — KETOROLAC TROMETHAMINE 15 MG/ML IJ SOLN
INTRAMUSCULAR | Status: AC
Start: 2015-07-02 — End: 2015-07-02
  Filled 2015-07-02: qty 1

## 2015-07-02 MED ORDER — PSYLLIUM 95 % PO PACK
1.0000 | PACK | Freq: Two times a day (BID) | ORAL | Status: DC
  Administered 2015-07-02 – 2015-07-03 (×3): 1 via ORAL
  Filled 2015-07-02 (×4): qty 1

## 2015-07-02 MED ORDER — POTASSIUM CHLORIDE CRYS ER 20 MEQ PO TBCR
40.0000 meq | EXTENDED_RELEASE_TABLET | Freq: Two times a day (BID) | ORAL | Status: AC
  Administered 2015-07-02: 40 meq via ORAL
  Filled 2015-07-02 (×2): qty 2

## 2015-07-02 MED ORDER — KETOROLAC TROMETHAMINE 15 MG/ML IJ SOLN
15.0000 mg | Freq: Four times a day (QID) | INTRAMUSCULAR | Status: DC | PRN
  Administered 2015-07-02: 15 mg via INTRAVENOUS

## 2015-07-02 NOTE — Progress Notes (Signed)
   Subjective: 1 Day Post-Op Procedure(s) (LRB): RIGHT TOTAL HIP ARTHROPLASTY ANTERIOR APPROACH (Right) Patient reports pain as moderate.   Patient seen in rounds with Dr. Lequita HaltAluisio. Patient is well, but has had some minor complaints of pain in the hip, requiring pain medications We will start therapy today.  Plan is to go Home after hospital stay.  Objective: Vital signs in last 24 hours: Temp:  [94.3 F (34.6 C)-98.1 F (36.7 C)] 98.1 F (36.7 C) (03/09 16100608) Pulse Rate:  [56-83] 72 (03/09 0608) Resp:  [11-18] 16 (03/09 0608) BP: (137-187)/(66-96) 150/68 mmHg (03/09 0608) SpO2:  [95 %-100 %] 95 % (03/09 0608) Weight:  [56.7 kg (125 lb)] 56.7 kg (125 lb) (03/08 1350)  Intake/Output from previous day:  Intake/Output Summary (Last 24 hours) at 07/02/15 0737 Last data filed at 07/02/15 0609  Gross per 24 hour  Intake 3928.75 ml  Output   2180 ml  Net 1748.75 ml    Intake/Output this shift:    Labs:  Recent Labs  07/02/15 0354  HGB 12.7    Recent Labs  07/02/15 0354  WBC 11.5*  RBC 3.77*  HCT 36.9  PLT 255    Recent Labs  07/02/15 0354  NA 136  K 3.0*  CL 96*  CO2 24  BUN 12  CREATININE 0.64  GLUCOSE 215*  CALCIUM 7.6*   No results for input(s): LABPT, INR in the last 72 hours.  EXAM General - Patient is Alert and Appropriate Extremity - Neurovascular intact Sensation intact distally Dorsiflexion/Plantar flexion intact Dressing - no drainage Motor Function - intact, moving foot and toes well on exam.  Hemovac pulled without difficulty.  Past Medical History  Diagnosis Date  . PONV (postoperative nausea and vomiting)   . Family history of adverse reaction to anesthesia     mother has problems with N/V   . Hypertension   . Hypothyroidism   . Arthritis     Assessment/Plan: 1 Day Post-Op Procedure(s) (LRB): RIGHT TOTAL HIP ARTHROPLASTY ANTERIOR APPROACH (Right) Active Problems:   OA (osteoarthritis) of hip  Estimated body mass index is  23.63 kg/(m^2) as calculated from the following:   Height as of this encounter: 5\' 1"  (1.549 m).   Weight as of this encounter: 56.7 kg (125 lb). Advance diet  DVT Prophylaxis - Xarelto Weight Bearing As Tolerated right Leg Hemovac Pulled Begin Therapy  Avel Peacerew Perkins, PA-C Orthopaedic Surgery 07/02/2015, 7:37 AM

## 2015-07-02 NOTE — Evaluation (Signed)
Physical Therapy Evaluation Patient Details Name: Kathryn [name] MRN: [id number] DOB: 1940/09/15 Today's Date: 07/02/2015   History of Present Illness  RDATHA  Clinical Impression  Patient  Has been feeling nauseated. + emesis after assisted to recliner. Patient will benefit from PT to address problems listed in the note below to address [problems listed in the note below.    Follow Up Recommendations Home health PT;Supervision/Assistance - 24 hour    Equipment Recommendations  None recommended by PT    Recommendations for Other Services       Precautions / Restrictions Precautions Precautions: Fall Restrictions Weight Bearing Restrictions: No RLE Weight Bearing: Weight bearing as tolerated      Mobility  Bed Mobility Overal bed mobility: Needs Assistance Bed Mobility: Supine to Sit     Supine to sit: HOB elevated;Min assist     General bed mobility comments: cues for technique,  Transfers Overall transfer level: Needs assistance Equipment used: Rolling walker (2 wheeled) Transfers: Sit to/from UGI CorporationStand;Stand Pivot Transfers Sit to Stand: Min assist;+2 safety/equipment Stand pivot transfers: Min assist;+2 safety/equipment       General transfer comment: patient c/o dizziness  BP 17/71 after pivot.  cues for hand placement. Feeling nausea. assisted to recliner.  Ambulation/Gait                Stairs            Wheelchair Mobility    Modified Rankin (Stroke Patients Only)       Balance                                             Pertinent Vitals/Pain Pain Assessment: 0-10 Pain Score: 2  Pain Location: R thigh and hip Pain Descriptors / Indicators: Discomfort;Tightness Pain Intervention(s): Limited activity within patient's tolerance;Monitored during session;Premedicated before session;Repositioned    Home Living Family/patient expects to be discharged to:: Private residence Living Arrangements: Alone;Other  relatives;Children Available Help at Discharge: Family Type of Home: House Home Access: Stairs to enter     Home Layout: One level Home Equipment: Environmental consultantWalker - 2 wheels;Bedside commode      Prior Function Level of Independence: Independent               Hand Dominance        Extremity/Trunk Assessment   Upper Extremity Assessment: Defer to OT evaluation           Lower Extremity Assessment: RLE deficits/detail RLE Deficits / Details: able to slide the ;leg to bed edge with min assist, bears weght.    Cervical / Trunk Assessment: Normal  Communication   Communication: No difficulties  Cognition Arousal/Alertness: Awake/alert Behavior During Therapy: Anxious Overall Cognitive Status: Within Functional Limits for tasks assessed                      General Comments      Exercises Total Joint Exercises Ankle Circles/Pumps: AROM;Both;10 reps;Supine Short Arc Quad: AAROM;Right;10 reps;Supine Heel Slides: AAROM;Right;10 reps;Supine      Assessment/Plan    PT Assessment Patient needs continued PT services  PT Diagnosis Difficulty walking;Generalized weakness;Acute pain   PT Problem List Decreased strength;Decreased range of motion;Decreased activity tolerance;Decreased mobility;Decreased knowledge of precautions;Decreased safety awareness;Decreased knowledge of use of DME;Pain  PT Treatment Interventions DME instruction;Gait training;Stair training;Functional mobility training;Therapeutic activities;Therapeutic exercise;Patient/family education   PT Goals (Current goals can be  found in the Care Plan section) Acute Rehab PT Goals Patient Stated Goal: to go home PT Goal Formulation: With patient/family Time For Goal Achievement: 07/05/15 Potential to Achieve Goals: Good    Frequency 7X/week   Barriers to discharge        Co-evaluation               End of Session   Activity Tolerance: Treatment limited secondary to medical complications  (Comment) (N/V) Patient left: in chair;with call bell/phone within reach;with chair alarm set;with family/visitor present Nurse Communication: Mobility status (N/V)         Time: 1610-9604 PT Time Calculation (min) (ACUTE ONLY): 46 min   Charges:   PT Evaluation $PT Eval Low Complexity: 1 Procedure PT Treatments $Gait Training: 8-22 mins $Self Care/Home Management: 8-22   PT G Codes:        Sharen Heck PT [phone]  07/02/2015, 12:15 PM

## 2015-07-02 NOTE — Progress Notes (Signed)
Utilization review completed.  

## 2015-07-02 NOTE — Care Management Note (Signed)
Case Management Note  Patient Details  Name: Kiylah Loyer MRN: [id number] Date of Birth: 10/21/1940  Subjective/Objective:                  RIGHT TOTAL HIP ARTHROPLASTY ANTERIOR APPROACH (Right) Action/Plan: Discharge planning Expected Discharge Date:  07/03/15               Expected Discharge Plan:  [place]  In-House Referral:     Discharge planning Services  CM Consult  Post Acute Care Choice:  Home Health Choice offered to:  Patient  DME Arranged:  N/A DME Agency:  NA  HH Arranged:  PT HH Agency:  [facility]  Status of Service:  Completed, signed off  Medicare Important Message Given:    Date Medicare IM Given:    Medicare IM give by:    Date Additional Medicare IM Given:    Additional Medicare Important Message give by:     If discussed at [place] of Stay Meetings, dates discussed:    Additional Comments: CM met with pt in room to offer choice of home health agency.  Pt chooses [facility] to render HHPT.  Referral given to [facility] rep, Tim (on unit).  Pt has both rolling walker and 3n1 at home.  No other CM needs were communicated. Dellie Catholic, RN 07/02/2015, 11:23 AM

## 2015-07-02 NOTE — Progress Notes (Signed)
Physical Therapy Treatment Patient Details Name: Kathryn [name] MRN: [id number] DOB: 1940/08/15 Today's Date: 07/02/2015    History of Present Illness RDATHA    PT Comments    The patient was feeling  Much better. Patient was able to ambulate this session.  Follow Up Recommendations  Home health PT;Supervision/Assistance - 24 hour     Equipment Recommendations  None recommended by PT    Recommendations for Other Services       Precautions / Restrictions Precautions Precautions: Fall Restrictions RLE Weight Bearing: Weight bearing as tolerated    Mobility  Bed Mobility Overal bed mobility: Needs Assistance Bed Mobility: Sit to Supine     Supine to sit: HOB elevated;Min assist Sit to supine: Mod assist   General bed mobility comments: assist with legs onto bed  Transfers Overall transfer level: Needs assistance Equipment used: Rolling walker (2 wheeled) Transfers: Sit to/from Stand Sit to Stand: Min assist Stand pivot transfers: Min assist;+2 safety/equipment       General transfer comment: cues for  arm and  R leg position  Ambulation/Gait Ambulation/Gait assistance: Min assist Ambulation Distance (Feet): 90 Feet Assistive device: Rolling walker (2 wheeled) Gait Pattern/deviations: Step-to pattern;Step-through pattern     General Gait Details: Feeling much better this session. No nausea or dizziness.   Stairs            Wheelchair Mobility    Modified Rankin (Stroke Patients Only)       Balance                                    Cognition Arousal/Alertness: Awake/alert Behavior During Therapy: Anxious Overall Cognitive Status: Within Functional Limits for tasks assessed                      Exercises Total Joint Exercises Ankle Circles/Pumps: AROM;Both;10 reps;Supine Short Arc Quad: AAROM;Right;10 reps;Supine Heel Slides: AAROM;Right;10 reps;Supine    General Comments        Pertinent Vitals/Pain Pain  Assessment: 0-10 Pain Score: 1  Pain Location: R thigh Pain Descriptors / Indicators: Discomfort Pain Intervention(s): Limited activity within patient's tolerance;Monitored during session;Premedicated before session;Repositioned    Home Living Family/patient expects to be discharged to:: Private residence Living Arrangements: Alone;Other relatives;Children Available Help at Discharge: Family Type of Home: House Home Access: Stairs to enter   Home Layout: One level Home Equipment: Environmental consultantWalker - 2 wheels;Bedside commode      Prior Function Level of Independence: Independent          PT Goals (current goals can now be found in the care plan section) Acute Rehab PT Goals Patient Stated Goal: to go home PT Goal Formulation: With patient/family Time For Goal Achievement: 07/05/15 Potential to Achieve Goals: Good Progress towards PT goals: Progressing toward goals    Frequency  7X/week    PT Plan Current plan remains appropriate    Co-evaluation             End of Session   Activity Tolerance: Patient tolerated treatment well Patient left: in bed;with call bell/phone within reach;with bed alarm set;with family/visitor present     Time: [id number] PT Time Calculation (min) (ACUTE ONLY): 19 min  Charges:  $Gait Training: 8-22 mins $    G Codes:      Kathryn [name], Kathryn [name] 07/02/2015, 1:48 PM

## 2015-07-02 NOTE — Progress Notes (Signed)
OT Cancellation Note  Patient Details Name: Kathryn MemosLinda [name] MRN: [id number] DOB: [birth year]   Cancelled Treatment:    Reason Eval/Treat Not Completed: Other (comment). Pt had nausea/vomiting this am and didn't feel great after second PT session.  Will check on her in the am  Yandel Zeiner 07/02/2015, 3:43 PM  Marica OtterMaryellen Briannie [name], OTR/L [phone] 07/02/2015

## 2015-07-03 LAB — BASIC METABOLIC PANEL
ANION GAP: 7 (ref 5–15)
BUN: 12 mg/dL (ref 6–20)
CALCIUM: 6.8 mg/dL — AB (ref 8.9–10.3)
CHLORIDE: 93 mmol/L — AB (ref 101–111)
CO2: 27 mmol/L (ref 22–32)
CREATININE: 0.63 mg/dL (ref 0.44–1.00)
GFR calc non Af Amer: >60 mL/min (ref >60)
Glucose, Bld: 227 mg/dL — ABNORMAL HIGH (ref 65–99)
Potassium: 3.1 mmol/L — ABNORMAL LOW (ref 3.5–5.1)
SODIUM: 134 mmol/L — AB (ref 135–145)

## 2015-07-03 LAB — CBC
HEMATOCRIT: 30.1 % — AB (ref 36.0–46.0)
HEMOGLOBIN: 10.7 g/dL — AB (ref 12.0–15.0)
MCH: 33.5 pg (ref 26.0–34.0)
MCHC: 35.5 g/dL (ref 30.0–36.0)
MCV: 94.4 fL (ref 78.0–100.0)
Platelets: 221 10*3/uL (ref 150–400)
RBC: 3.19 MIL/uL — AB (ref 3.87–5.11)
RDW: 11.6 % (ref 11.5–15.5)
WBC: 13.3 10*3/uL — AB (ref 4.0–10.5)

## 2015-07-03 MED ORDER — POTASSIUM CHLORIDE CRYS ER 20 MEQ PO TBCR
40.0000 meq | EXTENDED_RELEASE_TABLET | ORAL | Status: AC
Start: 2015-07-03 — End: 2015-07-03
  Administered 2015-07-03 (×2): 40 meq via ORAL
  Filled 2015-07-03 (×2): qty 2

## 2015-07-03 MED ORDER — OXYCODONE HCL 5 MG PO TABS
5.0000 mg | ORAL_TABLET | ORAL | Status: DC | PRN
Start: 2015-07-03 — End: 2020-11-12

## 2015-07-03 MED ORDER — TRAMADOL HCL 50 MG PO TABS: 50.0000 mg | ORAL_TABLET | Freq: Four times a day (QID) | ORAL | Status: DC | PRN

## 2015-07-03 MED ORDER — RIVAROXABAN 10 MG PO TABS: 10.0000 mg | ORAL_TABLET | Freq: Every day | ORAL | Status: DC

## 2015-07-03 MED ORDER — METHOCARBAMOL 500 MG PO TABS: 500.0000 mg | ORAL_TABLET | Freq: Four times a day (QID) | ORAL | Status: DC | PRN

## 2015-07-03 NOTE — Anesthesia Postprocedure Evaluation (Signed)
Anesthesia Post Note  Patient: Kathryn MemosLinda [name]  Procedure(s) Performed: Procedure(s) (LRB): RIGHT TOTAL HIP ARTHROPLASTY ANTERIOR APPROACH (Right)  Patient location during evaluation: PACU Anesthesia Type: Spinal Level of consciousness: awake and alert, awake, oriented and patient cooperative Pain management: pain level controlled Vital Signs Assessment: post-procedure vital signs reviewed and stable Respiratory status: spontaneous breathing, nonlabored ventilation, respiratory function stable and patient connected to nasal cannula oxygen Cardiovascular status: blood pressure returned to baseline and stable Postop Assessment: no signs of nausea or vomiting, spinal receding, adequate PO intake, no backache, no headache and patient able to bend at knees Anesthetic complications: no    Last Vitals:  Filed Vitals:   07/02/15 2145 07/03/15 0547  BP: 135/51 123/51  Pulse: 65 35  Temp: 37.1 C 37 C  Resp: 16 16    Last Pain:  Filed Vitals:   07/03/15 0722  PainSc: Asleep                 Cecile HearingStephen Edward Turk

## 2015-07-03 NOTE — Discharge Instructions (Signed)
° °Dr. Frank Aluisio °Total Joint Specialist °[facility] °[address] °[place], [place] [address] °[phone] ° °ANTERIOR APPROACH TOTAL HIP REPLACEMENT POSTOPERATIVE DIRECTIONS ° ° °Hip Rehabilitation, Guidelines Following Surgery  °The results of a hip operation are greatly improved after range of motion and muscle strengthening exercises. Follow all safety measures which are given to protect your hip. If any of these exercises cause increased pain or swelling in your joint, decrease the amount until you are comfortable again. Then slowly increase the exercises. Call your caregiver if you have problems or questions.  ° °HOME CARE INSTRUCTIONS  °Remove items at home which could result in a fall. This includes throw rugs or furniture in walking pathways.  °· ICE to the affected hip every three hours for 30 minutes at a time and then as needed for pain and swelling.  Continue to use ice on the hip for pain and swelling from surgery. You may notice swelling that will progress down to the foot and ankle.  This is normal after surgery.  Elevate the leg when you are not up walking on it.   °· Continue to use the breathing machine which will help keep your temperature down.  It is common for your temperature to cycle up and down following surgery, especially at night when you are not up moving around and exerting yourself.  The breathing machine keeps your lungs expanded and your temperature down. ° ° °DIET °You may resume your previous home diet once your are discharged from the hospital. ° °DRESSING / WOUND CARE / SHOWERING °You may shower 3 days after surgery, but keep the wounds dry during showering.  You may use an occlusive plastic wrap (Press'n Seal for example), NO SOAKING/SUBMERGING IN THE BATHTUB.  If the bandage gets wet, change with a clean dry gauze.  If the incision gets wet, pat the wound dry with a clean towel. °You may start showering once you are discharged home but do not  submerge the incision under water. Just pat the incision dry and apply a dry gauze dressing on daily. °Change the surgical dressing daily and reapply a dry dressing each time. ° °ACTIVITY °Walk with your walker as instructed. °Use walker as long as suggested by your caregivers. °Avoid periods of inactivity such as sitting longer than an hour when not asleep. This helps prevent blood clots.  °You may resume a sexual relationship in one month or when given the OK by your doctor.  °You may return to work once you are cleared by your doctor.  °Do not drive a car for 6 weeks or until released by you surgeon.  °Do not drive while taking narcotics. ° °WEIGHT BEARING °Weight bearing as tolerated with assist device (walker, cane, etc) as directed, use it as long as suggested by your surgeon or therapist, typically at least 4-6 weeks. ° °POSTOPERATIVE CONSTIPATION PROTOCOL °Constipation - defined medically as fewer than three stools per week and severe constipation as less than one stool per week. ° °One of the most common issues patients have following surgery is constipation.  Even if you have a regular bowel pattern at home, your normal regimen is likely to be disrupted due to multiple reasons following surgery.  Combination of anesthesia, postoperative narcotics, change in appetite and fluid intake all can affect your bowels.  In order to avoid complications following surgery, here are some recommendations in order to help you during your recovery period. ° °Colace (docusate) - Pick up an over-the-counter   form of Colace or another stool softener and take twice a day as long as you are requiring postoperative pain medications.  Take with a full glass of water daily.  If you experience loose stools or diarrhea, hold the colace until you stool forms back up.  If your symptoms do not get better within 1 week or if they get worse, check with your doctor. ° °Dulcolax (bisacodyl) - Pick up over-the-counter and take as directed  by the product packaging as needed to assist with the movement of your bowels.  Take with a full glass of water.  Use this product as needed if not relieved by Colace only.  ° °MiraLax (polyethylene glycol) - Pick up over-the-counter to have on hand.  MiraLax is a solution that will increase the amount of water in your bowels to assist with bowel movements.  Take as directed and can mix with a glass of water, juice, soda, coffee, or tea.  Take if you go more than two days without a movement. °Do not use MiraLax more than once per day. Call your doctor if you are still constipated or irregular after using this medication for 7 days in a row. ° °If you continue to have problems with postoperative constipation, please contact the office for further assistance and recommendations.  If you experience "the worst abdominal pain ever" or develop nausea or vomiting, please contact the office immediatly for further recommendations for treatment. ° °ITCHING ° If you experience itching with your medications, try taking only a single pain pill, or even half a pain pill at a time.  You can also use Benadryl over the counter for itching or also to help with sleep.  ° °TED HOSE STOCKINGS °Wear the elastic stockings on both legs for three weeks following surgery during the day but you may remove then at night for sleeping. ° °MEDICATIONS °See your medication summary on the “After Visit Summary” that the nursing staff will review with you prior to discharge.  You may have some home medications which will be placed on hold until you complete the course of blood thinner medication.  It is important for you to complete the blood thinner medication as prescribed by your surgeon.  Continue your approved medications as instructed at time of discharge. ° °PRECAUTIONS °If you experience chest pain or shortness of breath - call 911 immediately for transfer to the hospital emergency department.  °If you develop a fever greater that 101 F,  purulent drainage from wound, increased redness or drainage from wound, foul odor from the wound/dressing, or calf pain - CONTACT YOUR SURGEON.   °                                                °FOLLOW-UP APPOINTMENTS °Make sure you keep all of your appointments after your operation with your surgeon and caregivers. You should call the office at the above phone number and make an appointment for approximately two weeks after the date of your surgery or on the date instructed by your surgeon outlined in the "After Visit Summary". ° °RANGE OF MOTION AND STRENGTHENING EXERCISES  °These exercises are designed to help you keep full movement of your hip joint. Follow your caregiver's or physical therapist's instructions. Perform all exercises about fifteen times, three times per day or as directed. Exercise both hips, even if you   have had only one joint replacement. These exercises can be done on a training (exercise) mat, on the floor, on a table or on a bed. Use whatever works the best and is most comfortable for you. Use music or television while you are exercising so that the exercises are a pleasant break in your day. This will make your life better with the exercises acting as a break in routine you can look forward to.  °Lying on your back, slowly slide your foot toward your buttocks, raising your knee up off the floor. Then slowly slide your foot back down until your leg is straight again.  °Lying on your back spread your legs as far apart as you can without causing discomfort.  °Lying on your side, raise your upper leg and foot straight up from the floor as far as is comfortable. Slowly lower the leg and repeat.  °Lying on your back, tighten up the muscle in the front of your thigh (quadriceps muscles). You can do this by keeping your leg straight and trying to raise your heel off the floor. This helps strengthen the largest muscle supporting your knee.  °Lying on your back, tighten up the muscles of your  buttocks both with the legs straight and with the knee bent at a comfortable angle while keeping your heel on the floor.  ° °IF YOU ARE TRANSFERRED TO A SKILLED REHAB FACILITY °If the patient is transferred to a skilled rehab facility following release from the hospital, a list of the current medications will be sent to the facility for the patient to continue.  When discharged from the skilled rehab facility, please have the facility set up the patient's Home Health Physical Therapy prior to being released. Also, the skilled facility will be responsible for providing the patient with their medications at time of release from the facility to include their pain medication, the muscle relaxants, and their blood thinner medication. If the patient is still at the rehab facility at time of the two week follow up appointment, the skilled rehab facility will also need to assist the patient in arranging follow up appointment in our office and any transportation needs. ° °MAKE SURE YOU:  °Understand these instructions.  °Get help right away if you are not doing well or get worse.  ° ° °Pick up stool softner and laxative for home use following surgery while on pain medications. °Do not submerge incision under water. °Please use good hand washing techniques while changing dressing each day. °May shower starting three days after surgery. °Please use a clean towel to pat the incision dry following showers. °Continue to use ice for pain and swelling after surgery. °Do not use any lotions or creams on the incision until instructed by your surgeon. ° °Take Xarelto for two and a half more weeks, then discontinue Xarelto. °Once the patient has completed the Xarelto, they may resume the 81 mg Aspirin. ° °

## 2015-07-03 NOTE — Progress Notes (Signed)
   Subjective: 2 Days Post-Op Procedure(s) (LRB): RIGHT TOTAL HIP ARTHROPLASTY ANTERIOR APPROACH (Right) Patient reports pain as mild.   Patient seen in rounds with Dr. Lequita HaltAluisio. Patient is well, and has had no acute complaints or problems Patient is ready to go home  Objective: Vital signs in last 24 hours: Temp:  [98.1 F (36.7 C)-98.7 F (37.1 C)] 98.6 F (37 C) (03/10 0547) Pulse Rate:  [35-72] 35 (03/10 0547) Resp:  [15-16] 16 (03/10 0547) BP: (123-170)/(50-71) 123/51 mmHg (03/10 0547) SpO2:  [96 %-98 %] 98 % (03/10 0547)  Intake/Output from previous day:  Intake/Output Summary (Last 24 hours) at 07/03/15 0857 Last data filed at 07/03/15 0414  Gross per 24 hour  Intake   1020 ml  Output   3125 ml  Net  -2105 ml    Labs:  Recent Labs  07/02/15 0354 07/03/15 0345  HGB 12.7 10.7*    Recent Labs  07/02/15 0354 07/03/15 0345  WBC 11.5* 13.3*  RBC 3.77* 3.19*  HCT 36.9 30.1*  PLT 255 221    Recent Labs  07/02/15 0354 07/03/15 0345  NA 136 127*  K 3.0* 3.1*  CL 96* 93*  CO2 24 27  BUN 12 12  CREATININE 0.64 0.63  GLUCOSE 215* 227*  CALCIUM 7.6* 6.8*   No results for input(s): LABPT, INR in the last 72 hours.  EXAM: General - Patient is Alert and Appropriate Extremity - Neurovascular intact Sensation intact distally Dorsiflexion/Plantar flexion intact Incision - clean Motor Function - intact, moving foot and toes well on exam.   Assessment/Plan: 2 Days Post-Op Procedure(s) (LRB): RIGHT TOTAL HIP ARTHROPLASTY ANTERIOR APPROACH (Right) Procedure(s) (LRB): RIGHT TOTAL HIP ARTHROPLASTY ANTERIOR APPROACH (Right) Past Medical History  Diagnosis Date  . PONV (postoperative nausea and vomiting)   . Family history of adverse reaction to anesthesia     mother has problems with N/V   . Hypertension   . Hypothyroidism   . Arthritis    Active Problems:   OA (osteoarthritis) of hip  Estimated body mass index is 23.63 kg/(m^2) as calculated from  the following:   Height as of this encounter: 5\' 1"  (1.549 m).   Weight as of this encounter: 56.7 kg (125 lb). Up with therapy Discharge home with home health Diet - Cardiac diet Follow up - in 2 weeks Activity - WBAT Disposition - Home Condition Upon Discharge - Good D/C Meds - See DC Summary DVT Prophylaxis - Xarelto  Avel Peacerew Perkins, PA-C Orthopaedic Surgery 07/03/2015, 8:57 AM

## 2015-07-03 NOTE — Progress Notes (Signed)
Physical Therapy Treatment Patient Details Name: Kathryn MemosLinda [name] MRN: [id number] DOB: [birth year] Today's Date: 07/03/2015    History of Present Illness RDATHA    PT Comments    Ready for DC  Follow Up Recommendations  Home health PT;Supervision/Assistance - 24 hour     Equipment Recommendations       Recommendations for Other Services       Precautions / Restrictions Precautions Precautions: Fall Restrictions Weight Bearing Restrictions: No RLE Weight Bearing: Weight bearing as tolerated    Mobility  Bed Mobility Overal bed mobility: Needs Assistance Bed Mobility: Supine to Sit;Sit to Supine     Supine to sit: Min assist Sit to supine: Min assist   General bed mobility comments: used sheet to self assist, son present to assist, practiced getting onto high bed w./ a step.  Transfers Overall transfer level: Needs assistance Equipment used: Rolling walker (2 wheeled) Transfers: Sit to/from Stand Sit to Stand: Supervision         General transfer comment: cues for LE placement  Ambulation/Gait Ambulation/Gait assistance: Supervision Ambulation Distance (Feet): 200 Feet Assistive device: Rolling walker (2 wheeled) Gait Pattern/deviations: Step-through pattern     General Gait Details: Feeling much better    Stairs Stairs: Yes Stairs assistance: Min assist Stair Management: No rails;Step to pattern;Backwards;With walker Number of Stairs: 2 General stair comments: son present for instruction. reviewed to step backward on curb.  Wheelchair Mobility    Modified Rankin (Stroke Patients Only)       Balance                                    Cognition Arousal/Alertness: Awake/alert Behavior During Therapy: WFL for tasks assessed/performed Overall Cognitive Status: Within Functional Limits for tasks assessed                      Exercises Total Joint Exercises Ankle Circles/Pumps: AROM;Both;10 reps;Supine Quad Sets:  AROM;Both;10 reps;Supine Short Arc Quad: AROM;Right;10 reps;Supine Heel Slides: AROM;Right;10 reps;Supine Hip ABduction/ADduction: AAROM;Right;10 reps;Supine    General Comments        Pertinent Vitals/Pain Pain Score: 2  Pain Location:  thigh Pain Descriptors / Indicators: Sore Pain Intervention(s): Premedicated before session;Repositioned    Home Living Family/patient expects to be discharged to:: Private residence Living Arrangements: Alone;Other relatives;Children           Home Equipment: Bedside commode;Shower seat      Prior Function Level of Independence: Independent          PT Goals (current goals can now be found in the care plan section) Acute Rehab PT Goals Patient Stated Goal: to go home Progress towards PT goals: Progressing toward goals    Frequency       PT Plan      Co-evaluation             End of Session   Activity Tolerance: Patient tolerated treatment well Patient left: in bed;with call bell/phone within reach;with bed alarm set;with family/visitor present     Time: 1000-1038 PT Time Calculation (min) (ACUTE ONLY): 38 min  Charges:  $Gait Training: 8-22 mins $Therapeutic Exercise: 8-22 mins $Self Care/Home Management: 8-22                    G Codes:      Kathryn [name], Kathryn [name] Kathryn [name] 07/03/2015, 11:33 AM

## 2015-07-03 NOTE — Discharge Summary (Signed)
Physician Discharge Summary   Patient ID: Kathryn [name] MRN: [id number] DOB/AGE: Aug 29, 1940 75 y.o.  Admit date: 07/01/2015 Discharge date: 07/03/2015  Primary Diagnosis:  Osteoarthritis of the Right hip.   Admission Diagnoses:  Past Medical History  Diagnosis Date  . PONV (postoperative nausea and vomiting)   . Family history of adverse reaction to anesthesia     mother has problems with N/V   . Hypertension   . Hypothyroidism   . Arthritis    Discharge Diagnoses:   Active Problems:   OA (osteoarthritis) of hip  Estimated body mass index is 23.63 kg/(m^2) as calculated from the following:   Height as of this encounter: 5' 1"  (1.549 m).   Weight as of this encounter: 56.7 kg (125 lb).  Procedure(s) (LRB): RIGHT TOTAL HIP ARTHROPLASTY ANTERIOR APPROACH (Right)   Consults: None  HPI: Kathryn [name] is a 75 y.o. female who has advanced end-  stage arthritis of her Right hip with progressively worsening pain and  dysfunction.The patient has failed nonoperative management and presents for  total hip arthroplasty.   Laboratory Data: Admission on 07/01/2015, Discharged on 07/03/2015  Component Date Value Ref Range Status  . WBC 07/02/2015 11.5* 4.0 - 10.5 K/uL Final  . RBC 07/02/2015 3.77* 3.87 - 5.11 MIL/uL Final  . Hemoglobin 07/02/2015 12.7  12.0 - 15.0 g/dL Final  . HCT 07/02/2015 36.9  36.0 - 46.0 % Final  . MCV 07/02/2015 97.9  78.0 - 100.0 fL Final  . MCH 07/02/2015 33.7  26.0 - 34.0 pg Final  . MCHC 07/02/2015 34.4  30.0 - 36.0 g/dL Final  . RDW 07/02/2015 11.6  11.5 - 15.5 % Final  . Platelets 07/02/2015 255  150 - 400 K/uL Final  . Sodium 07/02/2015 136  135 - 145 mmol/L Final  . Potassium 07/02/2015 3.0* 3.5 - 5.1 mmol/L Final  . Chloride 07/02/2015 96* 101 - 111 mmol/L Final  . CO2 07/02/2015 24  22 - 32 mmol/L Final  . Glucose, Bld 07/02/2015 215* 65 - 99 mg/dL Final  . BUN 07/02/2015 12  6 - 20 mg/dL Final  . Creatinine, Ser 07/02/2015 0.64  0.44 - 1.00  mg/dL Final  . Calcium 07/02/2015 7.6* 8.9 - 10.3 mg/dL Final  . GFR calc non Af Amer 07/02/2015 >60  >60 mL/min Final  . GFR calc Af Amer 07/02/2015 >60  >60 mL/min Final   Comment: (NOTE) The eGFR has been calculated using the CKD EPI equation. This calculation has not been validated in all clinical situations. eGFR's persistently <60 mL/min signify possible Chronic Kidney Disease.   . Anion gap 07/02/2015 16* 5 - 15 Final  . WBC 07/03/2015 13.3* 4.0 - 10.5 K/uL Final  . RBC 07/03/2015 3.19* 3.87 - 5.11 MIL/uL Final  . Hemoglobin 07/03/2015 10.7* 12.0 - 15.0 g/dL Final  . HCT 07/03/2015 30.1* 36.0 - 46.0 % Final  . MCV 07/03/2015 94.4  78.0 - 100.0 fL Final  . MCH 07/03/2015 33.5  26.0 - 34.0 pg Final  . MCHC 07/03/2015 35.5  30.0 - 36.0 g/dL Final  . RDW 07/03/2015 11.6  11.5 - 15.5 % Final  . Platelets 07/03/2015 221  150 - 400 K/uL Final  . Sodium 07/03/2015 134* 135 - 145 mmol/L Corrected   Comment: CORRECTED RESULTS CALLED TO: D. FRANKLIN RN AT 1250 ON 03.10.17 BY SHUEA CORRECTED ON 03/10 AT 1250: PREVIOUSLY REPORTED AS 127 DELTA CHECK NOTED REPEATED TO VERIFY   . Potassium 07/03/2015 3.1* 3.5 - 5.1 mmol/L Final  .  Chloride 07/03/2015 93* 101 - 111 mmol/L Final  . CO2 07/03/2015 27  22 - 32 mmol/L Final  . Glucose, Bld 07/03/2015 227* 65 - 99 mg/dL Final  . BUN 07/03/2015 12  6 - 20 mg/dL Final  . Creatinine, Ser 07/03/2015 0.63  0.44 - 1.00 mg/dL Final  . Calcium 07/03/2015 6.8* 8.9 - 10.3 mg/dL Final  . GFR calc non Af Amer 07/03/2015 >60  >60 mL/min Final  . GFR calc Af Amer 07/03/2015 >60  >60 mL/min Final   Comment: (NOTE) The eGFR has been calculated using the CKD EPI equation. This calculation has not been validated in all clinical situations. eGFR's persistently <60 mL/min signify possible Chronic Kidney Disease.   Georgiann Hahn gap 07/03/2015 7  5 - 15 Final  Hospital Outpatient Visit on 06/23/2015  Component Date Value Ref Range Status  . aPTT 06/23/2015 38* 24 -  37 seconds Final   Comment:        IF BASELINE aPTT IS ELEVATED, SUGGEST PATIENT RISK ASSESSMENT BE USED TO DETERMINE APPROPRIATE ANTICOAGULANT THERAPY.   . Prothrombin Time 06/23/2015 14.1  11.6 - 15.2 seconds Final  . INR 06/23/2015 1.07  0.00 - 1.49 Final  . ABO/RH(D) 06/23/2015 O POS   Final  . Antibody Screen 06/23/2015 NEG   Final  . Sample Expiration 06/23/2015 07/04/2015   Final  . Extend sample reason 06/23/2015 NO TRANSFUSIONS OR PREGNANCY IN THE PAST 3 MONTHS   Final  . Color, Urine 06/23/2015 YELLOW  YELLOW Final  . APPearance 06/23/2015 CLEAR  CLEAR Final  . Specific Gravity, Urine 06/23/2015 1.006  1.005 - 1.030 Final  . pH 06/23/2015 7.5  5.0 - 8.0 Final  . Glucose, UA 06/23/2015 NEGATIVE  NEGATIVE mg/dL Final  . Hgb urine dipstick 06/23/2015 NEGATIVE  NEGATIVE Final  . Bilirubin Urine 06/23/2015 NEGATIVE  NEGATIVE Final  . Ketones, ur 06/23/2015 NEGATIVE  NEGATIVE mg/dL Final  . Protein, ur 06/23/2015 NEGATIVE  NEGATIVE mg/dL Final  . Nitrite 06/23/2015 NEGATIVE  NEGATIVE Final  . Leukocytes, UA 06/23/2015 NEGATIVE  NEGATIVE Final   MICROSCOPIC NOT DONE ON URINES WITH NEGATIVE PROTEIN, BLOOD, LEUKOCYTES, NITRITE, OR GLUCOSE <1000 mg/dL.  Marland Kitchen MRSA, PCR 06/23/2015 NEGATIVE  NEGATIVE Final  . Staphylococcus aureus 06/23/2015 NEGATIVE  NEGATIVE Final   Comment:        The Xpert SA Assay (FDA approved for NASAL specimens in patients over 50 years of age), is one component of a comprehensive surveillance program.  Test performance has been validated by [facility] for patients greater than or equal to 23 year old. It is not intended to diagnose infection nor to guide or monitor treatment.   . ABO/RH(D) 06/23/2015 O POS   Final     X-Rays:Dg Pelvis Portable  07/01/2015  CLINICAL DATA:  Postop right hip replacement EXAM: DG C-ARM 1-60 MIN-NO REPORT; PORTABLE PELVIS 1-2 VIEWS COMPARISON:  None. FINDINGS: Total right hip replacement with femoral and acetabular  components in anticipated position. Postoperative drain projects over the right hip joint. There is anticipated postoperative soft tissue change over the right hip joint as well. IMPRESSION: Anticipated postoperative appearance Electronically Signed   By: Skipper Cliche M.D.   On: 07/01/2015 18:24   Dg C-arm 1-60 Min-no Report  07/01/2015  CLINICAL DATA: surgery C-ARM 1-60 MINUTES Fluoroscopy was utilized by the requesting physician.  No radiographic interpretation.    EKG: Orders placed or performed during the hospital encounter of 06/23/15  . EKG 12-Lead  . EKG 12-Lead  Hospital Course: Patient was admitted to [facility] and taken to the OR and underwent the above state procedure without complications.  Patient tolerated the procedure well and was later transferred to the recovery room and then to the orthopaedic floor for postoperative care.  They were given PO and IV analgesics for pain control following their surgery.  They were given 24 hours of postoperative antibiotics of  Anti-infectives    Start     Dose/Rate Route Frequency Ordered Stop   07/01/15 2245  ceFAZolin (ANCEF) IVPB 2 g/50 mL premix     2 g 100 mL/hr over 30 Minutes Intravenous Every 6 hours 07/01/15 2243 07/02/15 0445   07/01/15 1400  ceFAZolin (ANCEF) IVPB 2 g/50 mL premix     2 g 100 mL/hr over 30 Minutes Intravenous On call to O.R. 07/01/15 1357 07/01/15 1553     and started on DVT prophylaxis in the form of Xarelto.   PT and OT were ordered for total hip protocol.  The patient was allowed to be WBAT with therapy. Discharge planning was consulted to help with postop disposition and equipment needs.  Patient had a good night on the evening of surgery.  They started to get up OOB with therapy on day one.  Hemovac drain was pulled without difficulty.  Continued to work with therapy into day two.  Dressing was changed on day two and the incision was healing well. Patient was seen in rounds on POD 2 and was  ready to go home.  Discharge home with home health Diet - Cardiac diet Follow up - in 2 weeks Activity - WBAT Disposition - Home Condition Upon Discharge - Good D/C Meds - See DC Summary DVT Prophylaxis - Xarelto       Discharge Instructions    Call MD / Call 911    Complete by:  As directed   If you experience chest pain or shortness of breath, CALL 911 and be transported to the hospital emergency room.  If you develope a fever above 101 F, pus (white drainage) or increased drainage or redness at the wound, or calf pain, call your surgeon's office.     Change dressing    Complete by:  As directed   You may change your dressing dressing daily with sterile 4 x 4 inch gauze dressing and paper tape.  Do not submerge the incision under water.     Constipation Prevention    Complete by:  As directed   Drink plenty of fluids.  Prune juice may be helpful.  You may use a stool softener, such as Colace (over the counter) 100 mg twice a day.  Use MiraLax (over the counter) for constipation as needed.     Diet - low sodium heart healthy    Complete by:  As directed      Discharge instructions    Complete by:  As directed   Pick up stool softner and laxative for home use following surgery while on pain medications. Do not submerge incision under water. Please use good hand washing techniques while changing dressing each day. May shower starting three days after surgery. Please use a clean towel to pat the incision dry following showers. Continue to use ice for pain and swelling after surgery. Do not use any lotions or creams on the incision until instructed by your surgeon.  Total Hip Protocol.  Take Xarelto for two and a half more weeks, then discontinue Xarelto. Once the patient has completed the  Xarelto, they may resume the 81 mg Aspirin.  Postoperative Constipation Protocol  Constipation - defined medically as fewer than three stools per week and severe constipation as less than one  stool per week.  One of the most common issues patients have following surgery is constipation.  Even if you have a regular bowel pattern at home, your normal regimen is likely to be disrupted due to multiple reasons following surgery.  Combination of anesthesia, postoperative narcotics, change in appetite and fluid intake all can affect your bowels.  In order to avoid complications following surgery, here are some recommendations in order to help you during your recovery period.  Colace (docusate) - Pick up an over-the-counter form of Colace or another stool softener and take twice a day as long as you are requiring postoperative pain medications.  Take with a full glass of water daily.  If you experience loose stools or diarrhea, hold the colace until you stool forms back up.  If your symptoms do not get better within 1 week or if they get worse, check with your doctor.  Dulcolax (bisacodyl) - Pick up over-the-counter and take as directed by the product packaging as needed to assist with the movement of your bowels.  Take with a full glass of water.  Use this product as needed if not relieved by Colace only.   MiraLax (polyethylene glycol) - Pick up over-the-counter to have on hand.  MiraLax is a solution that will increase the amount of water in your bowels to assist with bowel movements.  Take as directed and can mix with a glass of water, juice, soda, coffee, or tea.  Take if you go more than two days without a movement. Do not use MiraLax more than once per day. Call your doctor if you are still constipated or irregular after using this medication for 7 days in a row.  If you continue to have problems with postoperative constipation, please contact the office for further assistance and recommendations.  If you experience "the worst abdominal pain ever" or develop nausea or vomiting, please contact the office immediatly for further recommendations for treatment.     Do not sit on low chairs,  stoools or toilet seats, as it may be difficult to get up from low surfaces    Complete by:  As directed      Driving restrictions    Complete by:  As directed   No driving until released by the physician.     Increase activity slowly as tolerated    Complete by:  As directed      Lifting restrictions    Complete by:  As directed   No lifting until released by the physician.     Patient may shower    Complete by:  As directed   You may shower without a dressing once there is no drainage.  Do not wash over the wound.  If drainage remains, do not shower until drainage stops.     TED hose    Complete by:  As directed   Use stockings (TED hose) for 3 weeks on both leg(s).  You may remove them at night for sleeping.     Weight bearing as tolerated    Complete by:  As directed   Laterality:  right  Extremity:  Lower            Medication List    STOP taking these medications        aspirin EC 81 MG  tablet     CALTRATE 600 PO     Fish Oil 1200 MG Cpdr     naproxen sodium 220 MG tablet  Commonly known as:  ANAPROX     PRESERVISION AREDS 2 PO     Red Yeast Rice 600 MG Caps     VITAMIN B 12 PO     VITAMIN C PO      TAKE these medications        clonazePAM 1 MG tablet  Commonly known as:  KLONOPIN  Take 0.5 mg by mouth at bedtime as needed for anxiety.     docusate sodium 100 MG capsule  Commonly known as:  COLACE  Take 100 mg by mouth 2 (two) times daily.     hydrALAZINE 50 MG tablet  Commonly known as:  APRESOLINE  Take 50 mg by mouth 3 (three) times daily.     levothyroxine 88 MCG tablet  Commonly known as:  SYNTHROID, LEVOTHROID  Take 88 mcg by mouth daily before breakfast.     losartan 50 MG tablet  Commonly known as:  COZAAR  Take 100 mg by mouth daily. Patient takes in am     methocarbamol 500 MG tablet  Commonly known as:  ROBAXIN  Take 1 tablet (500 mg total) by mouth every 6 (six) hours as needed for muscle spasms.     metoprolol 50 MG tablet    Commonly known as:  LOPRESSOR  Take 50 mg by mouth 2 (two) times daily.     oxyCODONE 5 MG immediate release tablet  Commonly known as:  Oxy IR/ROXICODONE  Take 1-4 tablets (5-20 mg total) by mouth every 3 (three) hours as needed for moderate pain or severe pain.     polycarbophil 625 MG tablet  Commonly known as:  FIBERCON  Take 625 mg by mouth 2 (two) times daily.     psyllium 58.6 % powder  Commonly known as:  METAMUCIL  Take 1 packet by mouth 2 (two) times daily.     rivaroxaban 10 MG Tabs tablet  Commonly known as:  XARELTO  Take 1 tablet (10 mg total) by mouth daily with breakfast. Take Xarelto for two and a half more weeks, then discontinue Xarelto. Once the patient has completed the Xarelto, they may resume the 81 mg Aspirin.     traMADol 50 MG tablet  Commonly known as:  ULTRAM  Take 1-2 tablets (50-100 mg total) by mouth every 6 (six) hours as needed (mild pain).     triamterene-hydrochlorothiazide 37.5-25 MG capsule  Commonly known as:  DYAZIDE  Take 1 capsule by mouth every morning.       Follow-up Information    Follow up with [facility].   Why:  home health physical therapy   Contact information:   [place] [address] [phone]       Follow up with Gearlean Alf, MD. Schedule an appointment as soon as possible for a visit on 07/14/2015.   Specialty:  Orthopedic Surgery   Why:  Call office at [phone] to setup appointment on Tuesday 3.21 with the PA, Drew.   Contact information:   [address] [place] [address] [phone]       Signed: Arlee Muslim, PA-C Orthopaedic Surgery 07/09/2015, 8:51 AM

## 2015-07-03 NOTE — Evaluation (Signed)
Occupational Therapy Evaluation Patient Details Name: Kathryn [name] MRN: [id number] DOB: 2Alger Memos/25/1942 Today's Date: 07/03/2015    History of Present Illness RDATHA   Clinical Impression   This 75 year old female was admitted for the above surgery. All education was completed. No further OT is needed at this time    Follow Up Recommendations  No OT follow up;Supervision/Assistance - 24 hour    Equipment Recommendations  None recommended by OT    Recommendations for Other Services       Precautions / Restrictions Precautions Precautions: Fall Restrictions RLE Weight Bearing: Weight bearing as tolerated      Mobility Bed Mobility               General bed mobility comments: oob  Transfers   Equipment used: Rolling walker (2 wheeled) Transfers: Sit to/from Stand Sit to Stand: Supervision         General transfer comment: cues for LE placement    Balance                                            ADL Overall ADL's : Needs assistance/impaired     Grooming: Wash/dry hands;Supervision/safety;Standing   Upper Body Bathing: Supervision/ safety;Standing   Lower Body Bathing: Minimal assistance;Sit to/from stand   Upper Body Dressing : Set up;Sitting   Lower Body Dressing: Moderate assistance;Sit to/from stand   Toilet Transfer: Min guard;Ambulation;Comfort height toilet;RW   Toileting- Clothing Manipulation and Hygiene: Supervision/safety;Sit to/from stand         General ADL Comments: ambulated to bathroom and completed adl standing at sink and sitting on commode.  Used reacher for LB dressing.  She has one at home. She is very independent.  Demonstrated shower transfer, but she did not feel she needed to practice this     Vision     Perception     Praxis      Pertinent Vitals/Pain Pain Score: 4  Pain Location: R hip Pain Descriptors / Indicators: Sore     Hand Dominance     Extremity/Trunk Assessment Upper Extremity  Assessment Upper Extremity Assessment: Overall WFL for tasks assessed           Communication Communication Communication: No difficulties   Cognition Arousal/Alertness: Awake/alert Behavior During Therapy: WFL for tasks assessed/performed Overall Cognitive Status: Within Functional Limits for tasks assessed                     General Comments       Exercises       Shoulder Instructions      Home Living Family/patient expects to be discharged to:: Private residence Living Arrangements: Alone;Other relatives;Children                 Bathroom Shower/Tub: Producer, television/film/videoWalk-in shower   Bathroom Toilet: Standard     Home Equipment: Bedside commode;Shower seat          Prior Functioning/Environment Level of Independence: Independent             OT Diagnosis: Acute pain   OT Problem List:     OT Treatment/Interventions:      OT Goals(Current goals can be found in the care plan section) Acute Rehab OT Goals Patient Stated Goal: to go home OT Goal Formulation: All assessment and education complete, DC therapy  OT Frequency:     Barriers to  D/C:            Co-evaluation              End of Session    Activity Tolerance: Patient tolerated treatment well Patient left: in chair;with call bell/phone within reach;with chair alarm set   Time: [phone] OT Time Calculation (min): 26 min Charges:  OT General Charges $OT Visit: 1 Procedure OT Evaluation $OT Eval Low Complexity: 1 Procedure OT Treatments $Self Care/Home Management : 8-22 mins G-Codes:    Geo Slone 2015-07-06, 9:07 AM  Marica Otter, OTR/L [phone] 07-06-2015

## 2020-11-10 NOTE — H&P (Signed)
TOTAL KNEE ADMISSION H&P  Patient is being admitted for right total knee arthroplasty.  Subjective:  Chief Complaint: Right knee pain.  HPI: Kathryn [name], 80 y.o. female has a history of pain and functional disability in the right knee due to arthritis and has failed non-surgical conservative treatments for greater than 12 weeks to include corticosteriod injections and activity modification. Onset of symptoms was gradual, starting  several  years ago with gradually worsening course since that time. The patient noted prior procedures on the knee to include  arthroscopy on the right knee.  Patient currently rates pain in the right knee at 8 out of 10 with activity. Patient has night pain, worsening of pain with activity and weight bearing, pain with passive range of motion, and crepitus. Patient has evidence of  bone-on-bone arthritis in the medial compartment of the right knee with significant patellofemoral spurring and narrowing. There are also posterior osteophytes present  by imaging studies. There is no active infection.  Patient Active Problem List   Diagnosis Date Noted   OA (osteoarthritis) of hip 07/01/2015    Past Medical History:  Diagnosis Date   Arthritis    Family history of adverse reaction to anesthesia    mother has problems with N/V    Hypertension    Hypothyroidism    PONV (postoperative nausea and vomiting)     Past Surgical History:  Procedure Laterality Date   bilaterl carpal tunnel release      COLON RESECTION     right knee meniscal tear surgery      THYROIDECTOMY     TOTAL HIP ARTHROPLASTY Right 07/01/2015   Procedure: RIGHT TOTAL HIP ARTHROPLASTY ANTERIOR APPROACH;  Surgeon: Ollen Gross, MD;  Location: [facility];  Service: Orthopedics;  Laterality: Right;   TUBAL LIGATION      Prior to Admission medications   Medication Sig Start Date End Date Taking? Authorizing Provider  clonazePAM (KLONOPIN) 1 MG tablet Take 0.5 mg by mouth at bedtime as needed for  anxiety.    [provider]  docusate sodium (COLACE) 100 MG capsule Take 100 mg by mouth 2 (two) times daily.    [provider]  hydrALAZINE (APRESOLINE) 50 MG tablet Take 50 mg by mouth 3 (three) times daily.    [provider]  levothyroxine (SYNTHROID, LEVOTHROID) 88 MCG tablet Take 88 mcg by mouth daily before breakfast.    [provider]  losartan (COZAAR) 50 MG tablet Take 100 mg by mouth daily. Patient takes in am    [provider]  methocarbamol (ROBAXIN) 500 MG tablet Take 1 tablet (500 mg total) by mouth every 6 (six) hours as needed for muscle spasms. 07/03/15   Perkins, Alexzandrew L, PA-C  metoprolol (LOPRESSOR) 50 MG tablet Take 50 mg by mouth 2 (two) times daily.    [provider]  oxyCODONE (OXY IR/ROXICODONE) 5 MG immediate release tablet Take 1-4 tablets (5-20 mg total) by mouth every 3 (three) hours as needed for moderate pain or severe pain. 07/03/15   Perkins, Alexzandrew L, PA-C  polycarbophil (FIBERCON) 625 MG tablet Take 625 mg by mouth 2 (two) times daily.    [provider]  psyllium (METAMUCIL) 58.6 % powder Take 1 packet by mouth 2 (two) times daily.    [provider]  rivaroxaban (XARELTO) 10 MG TABS tablet Take 1 tablet (10 mg total) by mouth daily with breakfast. Take Xarelto for two and a half more weeks, then discontinue Xarelto. Once the patient has  completed the Xarelto, they may resume the 81 mg Aspirin. 07/03/15   Perkins, Alexzandrew L, PA-C  traMADol (ULTRAM) 50 MG tablet Take 1-2 tablets (50-100 mg total) by mouth every 6 (six) hours as needed (mild pain). 07/03/15   Perkins, Alexzandrew L, PA-C  triamterene-hydrochlorothiazide (DYAZIDE) 37.5-25 MG capsule Take 1 capsule by mouth every morning.    [provider]    No Known Allergies  Social History   Socioeconomic History   Marital status: Widowed    Spouse name: Not on file   Number of children: Not on file   Years of  education: Not on file   Highest education level: Not on file  Occupational History   Not on file  Tobacco Use   Smoking status: Never   Smokeless tobacco: Never  Substance and Sexual Activity   Alcohol use: Yes    Comment: occasional glass of wine    Drug use: No   Sexual activity: Not on file  Other Topics Concern   Not on file  Social History Narrative   Not on file   Social Determinants of Health   Financial Resource Strain: Not on file  Food Insecurity: Not on file  Transportation Needs: Not on file  Physical Activity: Not on file  Stress: Not on file  Social Connections: Not on file  Intimate Partner Violence: Not on file    Tobacco Use: Low Risk    Smoking Tobacco Use: Never   Smokeless Tobacco Use: Never   Social History   Substance and Sexual Activity  Alcohol Use Yes   Comment: occasional glass of wine     No family history on file.  Review of Systems  Constitutional:  Negative for chills and fever.  HENT:  Negative for congestion, sore throat and tinnitus.   Eyes:  Negative for double vision, photophobia and pain.  Respiratory:  Negative for cough, shortness of breath and wheezing.   Cardiovascular:  Negative for chest pain, palpitations and orthopnea.  Gastrointestinal:  Negative for heartburn, nausea and vomiting.  Genitourinary:  Negative for dysuria, frequency and urgency.  Musculoskeletal:  Positive for joint pain.  Neurological:  Negative for dizziness, weakness and headaches.   Objective:  Physical Exam: Well nourished and well developed.  General: Alert and oriented x3, cooperative and pleasant, no acute distress.  Head: normocephalic, atraumatic, neck supple.  Eyes: EOMI.  Respiratory: breath sounds clear in all fields, no wheezing, rales, or rhonchi. Cardiovascular: Regular rate and rhythm, no murmurs, gallops or rubs.  Abdomen: non-tender to palpation and soft, normoactive bowel sounds. Musculoskeletal:  Right Knee Exam:   No  effusion.   Range of motion is 0-125 degrees.   Moderate crepitus on range of motion of the knee.   Positive medial joint line tenderness.   No lateral joint line tenderness.   Stable knee.  Calves soft and nontender. Motor function intact in LE. Strength 5/5 LE bilaterally. Neuro: Distal pulses 2+. Sensation to light touch intact in LE.  Imaging Review Plain radiographs demonstrate severe degenerative joint disease of the right knee. The overall alignment is neutral. The bone quality appears to be adequate for age and reported activity level.  Assessment/Plan:  End stage arthritis, right knee   The patient history, physical examination, clinical judgment of the provider and imaging studies are consistent with end stage degenerative joint disease of the right knee and total knee arthroplasty is deemed medically necessary. The treatment options including medical management, injection therapy arthroscopy and arthroplasty were discussed  at length. The risks and benefits of total knee arthroplasty were presented and reviewed. The risks due to aseptic loosening, infection, stiffness, patella tracking problems, thromboembolic complications and other imponderables were discussed. The patient acknowledged the explanation, agreed to proceed with the plan and consent was signed. Patient is being admitted for inpatient treatment for surgery, pain control, PT, OT, prophylactic antibiotics, VTE prophylaxis, progressive ambulation and ADLs and discharge planning. The patient is planning to be discharged  home .   Patient's anticipated LOS is less than 2 midnights, meeting these requirements: - Younger than 70 - Lives within 1 hour of care - Has a competent adult at home to recover with post-op recover - NO history of  - Chronic pain requiring opiods  - Diabetes  - Coronary Artery Disease  - Heart failure  - Heart attack  - Stroke  - DVT/VTE  - Cardiac arrhythmia  - Respiratory Failure/COPD  -  Renal failure  - Anemia  - Advanced Liver disease  Therapy Plans: Outpatient therapy at [facility] ([place]) Disposition: Home with daughter Planned DVT Prophylaxis: Aspirin 325 mg BID DME Needed: None PCP: Hope Pigeon, DO (clearance received) Cardiologist: Heide Scales, MD (clearance received) TXA: IV Allergies: NKDA Anesthesia Concerns: N/V with general; prolonged numbness in right foot following spinal BMI: 23.4 Last HgbA1c: Not diabetic Pharmacy: [facility] ([address])  Other: - Chronic hyponatremia - No tramadol due to nausea  - Patient was instructed on what medications to stop prior to surgery. - Follow-up visit in 2 weeks with Dr. Lequita Halt - Begin physical therapy following surgery - Pre-operative lab work as pre-surgical testing - Prescriptions will be provided in hospital at time of discharge  Arther Abbott, PA-C Orthopedic Surgery [facility] [place]

## 2020-11-16 NOTE — Progress Notes (Addendum)
PCP - LOV 10-21-20 on chart  Clearance [facility] lov 09-22-20 epic Cardiologist - Heide Scales LOV 10-16-19  PPM/ICD -  Device Orders -  Rep Notified -   Chest x-ray -  EKG - 09-22-20 on chart Stress Test -  ECHO -  Cardiac Cath -   Sleep Study -  CPAP -   Fasting Blood Sugar -  Checks Blood Sugar _____ times a day  Blood Thinner Instructions: Aspirin Instructions: 81 mg asa stopped 1 week prior  ERAS Protcol - PRE-SURGERY Ensure or G2-  Fully vaccinated moderna  COVID TEST- 11-26-20 Activity--Able to ADL's without SOB , Does own housework without SOB  Anesthesia review: PAF, HTN,  Patient denies shortness of breath, fever, cough and chest pain at PAT appointment   All instructions explained to the patient, with a verbal understanding of the material. Patient agrees to go over the instructions while at home for a better understanding. Patient also instructed to self quarantine after being tested for COVID-19. The opportunity to ask questions was provided.

## 2020-11-16 NOTE — Patient Instructions (Signed)
DUE TO COVID-19 ONLY ONE VISITOR IS ALLOWED TO COME WITH YOU AND STAY IN THE WAITING ROOM ONLY DURING PRE OP AND PROCEDURE DAY OF SURGERY.   TWO VISITOR  MAY VISIT WITH YOU AFTER SURGERY IN YOUR PRIVATE ROOM DURING VISITING HOURS ONLY!  YOU NEED TO HAVE A COVID 19 TEST ON___8-4-22____ @__8  am- 3 pm_____, THIS TEST MUST BE DONE BEFORE SURGERY,    COVID TESTING SITE [address] [place]    Jacky Kindle           Your procedure is scheduled on: 11-30-20   Report to [facility] Main  Entrance   Report to admitting at     0655 AM     Call this number if you have problems the morning of surgery [phone]    Remember: NO SOLID FOOD AFTER MIDNIGHT THE NIGHT PRIOR TO SURGERY. NOTHING BY MOUTH EXCEPT CLEAR LIQUIDS UNTIL     0625 am then nothing by mouth.   PLEASE FINISH ENSURE DRINK PER SURGEON ORDER  WHICH NEEDS TO BE COMPLETED AT .04-04-1998 am  .     CLEAR LIQUID DIET   Foods Allowed                                                                     Foods Excluded water Black Coffee and tea, regular and decaf                             liquids that you cannot  Plain Jell-O any favor except red or purple                                           see through such as: Fruit ices (not with fruit pulp)                                                       milk, soups, orange juice  Iced Popsicles                                                    All solid food Carbonated beverages, regular and diet                                    Cranberry, grape and apple juices Sports drinks like Gatorade Lightly seasoned clear broth or consume(fat free) Sugar, honey syrup   _____________________________________________________________________    BRUSH YOUR TEETH MORNING OF SURGERY AND RINSE YOUR MOUTH OUT, NO CHEWING GUM CANDY OR MINTS.     Take these medicines the morning of surgery with A SIP OF WATER: Sotalol, omeprazole, levothyroxine, hydralazine, gabapentin,  diltiazem  You may not have any metal on your body including hair pins and              piercings  Do not wear jewelry, make-up, lotions, powders or perfumes, deodorant             Do not wear nail polish on your fingernails or toenails .  Do not shave  48 hours prior to surgery.               Do not bring valuables to the hospital. [facility] IS NOT             RESPONSIBLE   FOR VALUABLES.  Contacts, dentures or bridgework may not be worn into surgery.       Patients discharged the day of surgery will not be allowed to drive home. IF YOU ARE HAVING SURGERY AND GOING HOME THE SAME DAY, YOU MUST HAVE AN ADULT TO DRIVE YOU HOME AND BE WITH YOU FOR 24 HOURS. YOU MAY GO HOME BY TAXI OR UBER OR ORTHERWISE, BUT AN ADULT MUST ACCOMPANY YOU HOME AND STAY WITH YOU FOR 24 HOURS.  Name and phone number of your driver:  Special Instructions: N/A              Please read over the following fact sheets you were given: _____________________________________________________________________             [facility] - Preparing for Surgery Before surgery, you can play an important role.  Because skin is not sterile, your skin needs to be as free of germs as possible.  You can reduce the number of germs on your skin by washing with CHG (chlorahexidine gluconate) soap before surgery.  CHG is an antiseptic cleaner which kills germs and bonds with the skin to continue killing germs even after washing. Please DO NOT use if you have an allergy to CHG or antibacterial soaps.  If your skin becomes reddened/irritated stop using the CHG and inform your nurse when you arrive at Short Stay. Do not shave (including legs and underarms) for at least 48 hours prior to the first CHG shower.  You may shave your face/neck. Please follow these instructions carefully:  1.  Shower with CHG Soap the night before surgery and the  morning of Surgery.  2.  If you choose to wash your hair, wash your  hair first as usual with your  normal  shampoo.  3.  After you shampoo, rinse your hair and body thoroughly to remove the  shampoo.                           4.  Use CHG as you would any other liquid soap.  You can apply chg directly  to the skin and wash                       Gently with a scrungie or clean washcloth.  5.  Apply the CHG Soap to your body ONLY FROM THE NECK DOWN.   Do not use on face/ open                           Wound or open sores. Avoid contact with eyes, ears mouth and genitals (private parts).                       Wash face,  Genitals (private parts) with your normal soap.             6.  Wash thoroughly, paying special attention to the area where your surgery  will be performed.  7.  Thoroughly rinse your body with warm water from the neck down.  8.  DO NOT shower/wash with your normal soap after using and rinsing off  the CHG Soap.                9.  Pat yourself dry with a clean towel.            10.  Wear clean pajamas.            11.  Place clean sheets on your bed the night of your first shower and do not  sleep with pets. Day of Surgery : Do not apply any lotions/deodorants the morning of surgery.  Please wear clean clothes to the hospital/surgery center.  FAILURE TO FOLLOW THESE INSTRUCTIONS MAY RESULT IN THE CANCELLATION OF YOUR SURGERY PATIENT SIGNATURE_________________________________  NURSE SIGNATURE__________________________________  ____  Incentive Spirometer  An incentive spirometer is a tool that can help keep your lungs clear and active. This tool measures how well you are filling your lungs with each breath. Taking long deep breaths may help reverse or decrease the chance of developing breathing (pulmonary) problems (especially infection) following: A long period of time when you are unable to move or be active. BEFORE THE PROCEDURE  If the spirometer includes an indicator to show your best effort, your nurse or respiratory therapist will set it  to a desired goal. If possible, sit up straight or lean slightly forward. Try not to slouch. Hold the incentive spirometer in an upright position. INSTRUCTIONS FOR USE  Sit on the edge of your bed if possible, or sit up as far as you can in bed or on a chair. Hold the incentive spirometer in an upright position. Breathe out normally. Place the mouthpiece in your mouth and seal your lips tightly around it. Breathe in slowly and as deeply as possible, raising the piston or the ball toward the top of the column. Hold your breath for 3-5 seconds or for as long as possible. Allow the piston or ball to fall to the bottom of the column. Remove the mouthpiece from your mouth and breathe out normally. Rest for a few seconds and repeat Steps 1 through 7 at least 10 times every 1-2 hours when you are awake. Take your time and take a few normal breaths between deep breaths. The spirometer may include an indicator to show your best effort. Use the indicator as a goal to work toward during each repetition. After each set of 10 deep breaths, practice coughing to be sure your lungs are clear. If you have an incision (the cut made at the time of surgery), support your incision when coughing by placing a pillow or rolled up towels firmly against it. Once you are able to get out of bed, walk around indoors and cough well. You may stop using the incentive spirometer when instructed by your caregiver.  RISKS AND COMPLICATIONS Take your time so you do not get dizzy or light-headed. If you are in pain, you may need to take or ask for pain medication before doing incentive spirometry. It is harder to take a deep breath if you are having pain. AFTER USE Rest and breathe slowly and easily. It can be helpful to keep track of a log of your  progress. Your caregiver can provide you with a simple table to help with this. If you are using the spirometer at home, follow these instructions: SEEK MEDICAL CARE IF:  You are  having difficultly using the spirometer. You have trouble using the spirometer as often as instructed. Your pain medication is not giving enough relief while using the spirometer. You develop fever of 100.5 F (38.1 C) or higher. SEEK IMMEDIATE MEDICAL CARE IF:  You cough up bloody sputum that had not been present before. You develop fever of 102 F (38.9 C) or greater. You develop worsening pain at or near the incision site. MAKE SURE YOU:  Understand these instructions. Will watch your condition. Will get help right away if you are not doing well or get worse. Document Released: 08/22/2006 Document Revised: 07/04/2011 Document Reviewed: 10/23/2006 [facility] Patient Information 2014 [employer], [place].   ________________________________________________________________________ ____________________________________________________________________

## 2020-11-18 ENCOUNTER — Encounter (HOSPITAL_COMMUNITY): Payer: Self-pay

## 2020-11-18 ENCOUNTER — Other Ambulatory Visit: Payer: Self-pay

## 2020-11-18 ENCOUNTER — Encounter (HOSPITAL_COMMUNITY)
Admission: RE | Admit: 2020-11-18 | Discharge: 2020-11-18 | Disposition: A | Discharge status: Home / Self Care | Payer: Medicare Other | Source: Ambulatory Visit | Attending: Orthopedic Surgery | Admitting: Orthopedic Surgery

## 2020-11-18 DIAGNOSIS — Z01812 Encounter for preprocedural laboratory examination: Secondary | ICD-10-CM | POA: Insufficient documentation

## 2020-11-18 HISTORY — DX: Prediabetes: R73.03

## 2020-11-18 HISTORY — DX: Cardiac arrhythmia, unspecified: I49.9

## 2020-11-18 LAB — COMPREHENSIVE METABOLIC PANEL
ALT: 19 U/L (ref 0–44)
AST: 22 U/L (ref 15–41)
Albumin: 4.3 g/dL (ref 3.5–5.0)
Alkaline Phosphatase: 101 U/L (ref 38–126)
Anion gap: 10 (ref 5–15)
BUN: 13 mg/dL (ref 8–23)
CO2: 26 mmol/L (ref 22–32)
Calcium: 8.8 mg/dL — ABNORMAL LOW (ref 8.9–10.3)
Chloride: 94 mmol/L — ABNORMAL LOW (ref 98–111)
Creatinine, Ser: 0.69 mg/dL (ref 0.44–1.00)
GFR, Estimated: >60 mL/min (ref >60)
Glucose, Bld: 130 mg/dL — ABNORMAL HIGH (ref 70–99)
Potassium: 4.6 mmol/L (ref 3.5–5.1)
Sodium: 130 mmol/L — ABNORMAL LOW (ref 135–145)
Total Bilirubin: 0.9 mg/dL (ref 0.3–1.2)
Total Protein: 7.2 g/dL (ref 6.5–8.1)

## 2020-11-18 LAB — CBC
HCT: 40.7 % (ref 36.0–46.0)
Hemoglobin: 14 g/dL (ref 12.0–15.0)
MCH: 34.1 pg — ABNORMAL HIGH (ref 26.0–34.0)
MCHC: 34.4 g/dL (ref 30.0–36.0)
MCV: 99.3 fL (ref 80.0–100.0)
Platelets: 285 10*3/uL (ref 150–400)
RBC: 4.1 MIL/uL (ref 3.87–5.11)
RDW: 11.1 % — ABNORMAL LOW (ref 11.5–15.5)
WBC: 7.3 10*3/uL (ref 4.0–10.5)
nRBC: 0 % (ref 0.0–0.2)

## 2020-11-18 LAB — SURGICAL PCR SCREEN
MRSA, PCR: NEGATIVE
Staphylococcus aureus: NEGATIVE

## 2020-11-18 LAB — PROTIME-INR
INR: 1 (ref 0.8–1.2)
Prothrombin Time: 13.1 seconds (ref 11.4–15.2)

## 2020-11-19 LAB — HEMOGLOBIN A1C
Hgb A1c MFr Bld: 6.6 % — ABNORMAL HIGH (ref 4.8–5.6)
Mean Plasma Glucose: 143 mg/dL

## 2020-11-26 ENCOUNTER — Other Ambulatory Visit: Payer: Self-pay | Admitting: Orthopedic Surgery

## 2020-11-26 LAB — SARS CORONAVIRUS 2 (TAT 6-24 HRS): SARS Coronavirus 2: NEGATIVE

## 2020-11-29 MED ORDER — BUPIVACAINE LIPOSOME 1.3 % IJ SUSP
20.0000 mL | INTRAMUSCULAR | Status: DC
  Filled 2020-11-29: qty 20

## 2020-11-30 ENCOUNTER — Ambulatory Visit (HOSPITAL_COMMUNITY): Payer: Medicare Other | Admitting: Certified Registered Nurse Anesthetist

## 2020-11-30 ENCOUNTER — Other Ambulatory Visit: Payer: Self-pay

## 2020-11-30 ENCOUNTER — Encounter (HOSPITAL_COMMUNITY)
Admission: RE | Disposition: A | Discharge status: Home / Self Care | Payer: Self-pay | Source: Home / Self Care | Attending: Orthopedic Surgery

## 2020-11-30 ENCOUNTER — Encounter (HOSPITAL_COMMUNITY): Payer: Self-pay | Admitting: Orthopedic Surgery

## 2020-11-30 ENCOUNTER — Observation Stay (HOSPITAL_COMMUNITY)
Admission: RE | Admit: 2020-11-30 | Discharge: 2020-12-01 | Disposition: A | Discharge status: Home / Self Care | Payer: Medicare Other | Attending: Orthopedic Surgery | Admitting: Orthopedic Surgery

## 2020-11-30 DIAGNOSIS — E039 Hypothyroidism, unspecified: Secondary | ICD-10-CM | POA: Insufficient documentation

## 2020-11-30 DIAGNOSIS — M1711 Unilateral primary osteoarthritis, right knee: Secondary | ICD-10-CM | POA: Diagnosis present

## 2020-11-30 DIAGNOSIS — M179 Osteoarthritis of knee, unspecified: Secondary | ICD-10-CM | POA: Diagnosis present

## 2020-11-30 DIAGNOSIS — Z96641 Presence of right artificial hip joint: Secondary | ICD-10-CM | POA: Insufficient documentation

## 2020-11-30 DIAGNOSIS — Z79899 Other long term (current) drug therapy: Secondary | ICD-10-CM | POA: Diagnosis not present

## 2020-11-30 DIAGNOSIS — I10 Essential (primary) hypertension: Secondary | ICD-10-CM | POA: Diagnosis not present

## 2020-11-30 DIAGNOSIS — Z7901 Long term (current) use of anticoagulants: Secondary | ICD-10-CM | POA: Insufficient documentation

## 2020-11-30 DIAGNOSIS — M1710 Unilateral primary osteoarthritis, unspecified knee: Secondary | ICD-10-CM

## 2020-11-30 HISTORY — PX: TOTAL KNEE ARTHROPLASTY: SHX125

## 2020-11-30 SURGERY — ARTHROPLASTY, KNEE, TOTAL
Anesthesia: Spinal | Site: Knee | Laterality: Right

## 2020-11-30 MED ORDER — POLYETHYLENE GLYCOL 3350 17 G PO PACK: 17.0000 g | PACK | Freq: Every day | ORAL | Status: DC | PRN

## 2020-11-30 MED ORDER — OXYCODONE HCL 5 MG PO TABS: 5.0000 mg | ORAL_TABLET | ORAL | Status: DC | PRN

## 2020-11-30 MED ORDER — SODIUM CHLORIDE (PF) 0.9 % IJ SOLN
INTRAMUSCULAR | Status: DC | PRN
  Administered 2020-11-30: 60 mL

## 2020-11-30 MED ORDER — EPHEDRINE 5 MG/ML INJ
INTRAVENOUS | Status: AC
  Filled 2020-11-30: qty 5

## 2020-11-30 MED ORDER — PHENOL 1.4 % MT LIQD: 1.0000 | OROMUCOSAL | Status: DC | PRN

## 2020-11-30 MED ORDER — ACETAMINOPHEN 500 MG PO TABS
1000.0000 mg | ORAL_TABLET | Freq: Four times a day (QID) | ORAL | Status: DC
  Administered 2020-11-30 – 2020-12-01 (×3): 1000 mg via ORAL
  Filled 2020-11-30 (×3): qty 2

## 2020-11-30 MED ORDER — LOSARTAN POTASSIUM 50 MG PO TABS
100.0000 mg | ORAL_TABLET | Freq: Every morning | ORAL | Status: DC
  Administered 2020-12-01: 100 mg via ORAL

## 2020-11-30 MED ORDER — PANTOPRAZOLE SODIUM 40 MG PO TBEC
40.0000 mg | DELAYED_RELEASE_TABLET | Freq: Every day | ORAL | Status: DC
  Administered 2020-12-01: 40 mg via ORAL
  Filled 2020-11-30 (×2): qty 1

## 2020-11-30 MED ORDER — ACETAMINOPHEN 10 MG/ML IV SOLN
1000.0000 mg | Freq: Once | INTRAVENOUS | Status: AC
  Administered 2020-11-30: 1000 mg via INTRAVENOUS
  Filled 2020-11-30: qty 100

## 2020-11-30 MED ORDER — ONDANSETRON HCL 4 MG/2ML IJ SOLN
INTRAMUSCULAR | Status: DC | PRN
  Administered 2020-11-30: 4 mg via INTRAVENOUS

## 2020-11-30 MED ORDER — PROPOFOL 1000 MG/100ML IV EMUL
INTRAVENOUS | Status: AC
  Filled 2020-11-30: qty 100

## 2020-11-30 MED ORDER — DEXAMETHASONE SODIUM PHOSPHATE 10 MG/ML IJ SOLN
10.0000 mg | Freq: Once | INTRAMUSCULAR | Status: AC
  Administered 2020-12-01: 10 mg via INTRAVENOUS
  Filled 2020-11-30: qty 1

## 2020-11-30 MED ORDER — MORPHINE SULFATE (PF) 2 MG/ML IV SOLN
0.5000 mg | INTRAVENOUS | Status: DC | PRN
  Administered 2020-11-30: 1 mg via INTRAVENOUS
  Filled 2020-11-30: qty 1

## 2020-11-30 MED ORDER — PHENYLEPHRINE HCL (PRESSORS) 10 MG/ML IV SOLN
INTRAVENOUS | Status: AC
  Filled 2020-11-30: qty 1

## 2020-11-30 MED ORDER — ONDANSETRON HCL 4 MG/2ML IJ SOLN
4.0000 mg | Freq: Four times a day (QID) | INTRAMUSCULAR | Status: DC | PRN
  Administered 2020-12-01: 4 mg via INTRAVENOUS

## 2020-11-30 MED ORDER — LIDOCAINE HCL (CARDIAC) PF 100 MG/5ML IV SOSY
PREFILLED_SYRINGE | INTRAVENOUS | Status: DC | PRN
  Administered 2020-11-30: 40 mg via INTRAVENOUS

## 2020-11-30 MED ORDER — ONDANSETRON HCL 4 MG/2ML IJ SOLN
INTRAMUSCULAR | Status: AC
  Filled 2020-11-30: qty 2

## 2020-11-30 MED ORDER — SOTALOL HCL 80 MG PO TABS
80.0000 mg | ORAL_TABLET | Freq: Two times a day (BID) | ORAL | Status: DC
  Administered 2020-11-30 – 2020-12-01 (×2): 80 mg via ORAL
  Filled 2020-11-30 (×2): qty 1

## 2020-11-30 MED ORDER — GABAPENTIN 300 MG PO CAPS: 300.0000 mg | ORAL_CAPSULE | ORAL | Status: DC

## 2020-11-30 MED ORDER — LEVOTHYROXINE SODIUM 100 MCG PO TABS
100.0000 ug | ORAL_TABLET | Freq: Every day | ORAL | Status: DC
  Administered 2020-12-01: 100 ug via ORAL
  Filled 2020-11-30: qty 1

## 2020-11-30 MED ORDER — EPHEDRINE SULFATE-NACL 50-0.9 MG/10ML-% IV SOSY
PREFILLED_SYRINGE | INTRAVENOUS | Status: DC | PRN
  Administered 2020-11-30 (×3): 5 mg via INTRAVENOUS

## 2020-11-30 MED ORDER — LACTATED RINGERS IV SOLN: INTRAVENOUS | Status: DC

## 2020-11-30 MED ORDER — MENTHOL 3 MG MT LOZG: 1.0000 | LOZENGE | OROMUCOSAL | Status: DC | PRN

## 2020-11-30 MED ORDER — RIVAROXABAN 10 MG PO TABS
10.0000 mg | ORAL_TABLET | Freq: Every day | ORAL | Status: DC
  Administered 2020-12-01: 10 mg via ORAL
  Filled 2020-11-30: qty 1

## 2020-11-30 MED ORDER — METOCLOPRAMIDE HCL 5 MG/ML IJ SOLN
5.0000 mg | Freq: Three times a day (TID) | INTRAMUSCULAR | Status: DC | PRN
Start: 2020-11-30 — End: 2020-12-01

## 2020-11-30 MED ORDER — TRANEXAMIC ACID-NACL 1000-0.7 MG/100ML-% IV SOLN
1000.0000 mg | INTRAVENOUS | Status: AC
  Administered 2020-11-30: 1000 mg via INTRAVENOUS
  Filled 2020-11-30: qty 100

## 2020-11-30 MED ORDER — SODIUM CHLORIDE 0.9 % IR SOLN
Status: DC | PRN
  Administered 2020-11-30: 1000 mL

## 2020-11-30 MED ORDER — LIDOCAINE 2% (20 MG/ML) 5 ML SYRINGE
INTRAMUSCULAR | Status: AC
  Filled 2020-11-30: qty 5

## 2020-11-30 MED ORDER — SPIRONOLACTONE 12.5 MG HALF TABLET
12.5000 mg | ORAL_TABLET | Freq: Every morning | ORAL | Status: DC
  Administered 2020-12-01: 12.5 mg via ORAL
  Filled 2020-11-30: qty 1

## 2020-11-30 MED ORDER — ROPIVACAINE HCL 5 MG/ML IJ SOLN
INTRAMUSCULAR | Status: DC | PRN
  Administered 2020-11-30: 30 mL via PERINEURAL

## 2020-11-30 MED ORDER — BISACODYL 10 MG RE SUPP: 10.0000 mg | Freq: Every day | RECTAL | Status: DC | PRN

## 2020-11-30 MED ORDER — GABAPENTIN 300 MG PO CAPS
600.0000 mg | ORAL_CAPSULE | Freq: Every day | ORAL | Status: DC
  Administered 2020-11-30: 600 mg via ORAL
  Filled 2020-11-30: qty 2

## 2020-11-30 MED ORDER — PHENYLEPHRINE HCL-NACL 20-0.9 MG/250ML-% IV SOLN
INTRAVENOUS | Status: DC | PRN
  Administered 2020-11-30: 25 ug/min via INTRAVENOUS

## 2020-11-30 MED ORDER — CHLORHEXIDINE GLUCONATE 0.12 % MT SOLN
15.0000 mL | Freq: Once | OROMUCOSAL | Status: AC
  Administered 2020-11-30: 15 mL via OROMUCOSAL

## 2020-11-30 MED ORDER — POVIDONE-IODINE 10 % EX SWAB
2.0000 "application " | Freq: Once | CUTANEOUS | Status: AC
  Administered 2020-11-30: 2 via TOPICAL

## 2020-11-30 MED ORDER — METOCLOPRAMIDE HCL 5 MG PO TABS: 5.0000 mg | ORAL_TABLET | Freq: Three times a day (TID) | ORAL | Status: DC | PRN

## 2020-11-30 MED ORDER — ORAL CARE MOUTH RINSE: 15.0000 mL | Freq: Once | OROMUCOSAL | Status: AC

## 2020-11-30 MED ORDER — LORAZEPAM 0.5 MG PO TABS
0.5000 mg | ORAL_TABLET | Freq: Every day | ORAL | Status: DC
  Administered 2020-11-30: 0.5 mg via ORAL
  Filled 2020-11-30: qty 1

## 2020-11-30 MED ORDER — STERILE WATER FOR IRRIGATION IR SOLN
Status: DC | PRN
  Administered 2020-11-30 (×2): 1000 mL

## 2020-11-30 MED ORDER — CEFAZOLIN SODIUM-DEXTROSE 2-4 GM/100ML-% IV SOLN
2.0000 g | INTRAVENOUS | Status: AC
  Administered 2020-11-30: 2 g via INTRAVENOUS
  Filled 2020-11-30: qty 100

## 2020-11-30 MED ORDER — GABAPENTIN 300 MG PO CAPS
300.0000 mg | ORAL_CAPSULE | Freq: Two times a day (BID) | ORAL | Status: DC
  Administered 2020-11-30 – 2020-12-01 (×2): 300 mg via ORAL
  Filled 2020-11-30 (×2): qty 1

## 2020-11-30 MED ORDER — CEFAZOLIN SODIUM-DEXTROSE 2-4 GM/100ML-% IV SOLN
2.0000 g | Freq: Four times a day (QID) | INTRAVENOUS | Status: AC
Start: 2020-11-30 — End: 2020-11-30
  Administered 2020-11-30 (×2): 2 g via INTRAVENOUS
  Filled 2020-11-30 (×2): qty 100

## 2020-11-30 MED ORDER — FENTANYL CITRATE (PF) 100 MCG/2ML IJ SOLN
50.0000 ug | INTRAMUSCULAR | Status: DC
  Administered 2020-11-30: 50 ug via INTRAVENOUS
  Filled 2020-11-30: qty 2

## 2020-11-30 MED ORDER — METHOCARBAMOL 1000 MG/10ML IJ SOLN
500.0000 mg | Freq: Four times a day (QID) | INTRAVENOUS | Status: DC | PRN
  Filled 2020-11-30: qty 5

## 2020-11-30 MED ORDER — BUPIVACAINE IN DEXTROSE 0.75-8.25 % IT SOLN
INTRATHECAL | Status: DC | PRN
  Administered 2020-11-30: 1.6 mL via INTRATHECAL

## 2020-11-30 MED ORDER — PROPOFOL 10 MG/ML IV BOLUS
INTRAVENOUS | Status: DC | PRN
  Administered 2020-11-30: 10 mg via INTRAVENOUS
  Administered 2020-11-30 (×2): 20 mg via INTRAVENOUS

## 2020-11-30 MED ORDER — CLONIDINE HCL (ANALGESIA) 100 MCG/ML EP SOLN
EPIDURAL | Status: DC | PRN
  Administered 2020-11-30: 50 ug

## 2020-11-30 MED ORDER — DIPHENHYDRAMINE HCL 12.5 MG/5ML PO ELIX: 12.5000 mg | ORAL_SOLUTION | ORAL | Status: DC | PRN

## 2020-11-30 MED ORDER — OXYCODONE HCL 5 MG PO TABS
10.0000 mg | ORAL_TABLET | ORAL | Status: DC | PRN
  Administered 2020-11-30 – 2020-12-01 (×4): 10 mg via ORAL
  Filled 2020-11-30 (×4): qty 2

## 2020-11-30 MED ORDER — FENTANYL CITRATE (PF) 100 MCG/2ML IJ SOLN: 25.0000 ug | INTRAMUSCULAR | Status: DC | PRN

## 2020-11-30 MED ORDER — PROPOFOL 500 MG/50ML IV EMUL
INTRAVENOUS | Status: DC | PRN
  Administered 2020-11-30: 70 ug/kg/min via INTRAVENOUS

## 2020-11-30 MED ORDER — METHOCARBAMOL 500 MG PO TABS
500.0000 mg | ORAL_TABLET | Freq: Four times a day (QID) | ORAL | Status: DC | PRN
  Administered 2020-11-30: 500 mg via ORAL
  Filled 2020-11-30: qty 1

## 2020-11-30 MED ORDER — DOCUSATE SODIUM 100 MG PO CAPS
100.0000 mg | ORAL_CAPSULE | Freq: Two times a day (BID) | ORAL | Status: DC
  Administered 2020-11-30 – 2020-12-01 (×2): 100 mg via ORAL
  Filled 2020-11-30 (×2): qty 1

## 2020-11-30 MED ORDER — ONDANSETRON HCL 4 MG/2ML IJ SOLN: 4.0000 mg | Freq: Once | INTRAMUSCULAR | Status: DC | PRN

## 2020-11-30 MED ORDER — HYDRALAZINE HCL 50 MG PO TABS
50.0000 mg | ORAL_TABLET | Freq: Two times a day (BID) | ORAL | Status: DC
  Administered 2020-12-01: 50 mg via ORAL
  Filled 2020-11-30: qty 1

## 2020-11-30 MED ORDER — SODIUM CHLORIDE 0.9 % IV SOLN: INTRAVENOUS | Status: DC

## 2020-11-30 MED ORDER — DEXAMETHASONE SODIUM PHOSPHATE 10 MG/ML IJ SOLN
INTRAMUSCULAR | Status: DC | PRN
  Administered 2020-11-30: 4 mg via INTRAVENOUS

## 2020-11-30 MED ORDER — BUPIVACAINE LIPOSOME 1.3 % IJ SUSP
INTRAMUSCULAR | Status: DC | PRN
  Administered 2020-11-30: 20 mL

## 2020-11-30 MED ORDER — DILTIAZEM HCL ER COATED BEADS 240 MG PO CP24
240.0000 mg | ORAL_CAPSULE | Freq: Every morning | ORAL | Status: DC
  Administered 2020-12-01: 240 mg via ORAL

## 2020-11-30 MED ORDER — FLEET ENEMA 7-19 GM/118ML RE ENEM: 1.0000 | ENEMA | Freq: Once | RECTAL | Status: DC | PRN

## 2020-11-30 MED ORDER — ONDANSETRON HCL 4 MG PO TABS: 4.0000 mg | ORAL_TABLET | Freq: Four times a day (QID) | ORAL | Status: DC | PRN

## 2020-11-30 MED ORDER — DEXAMETHASONE SODIUM PHOSPHATE 10 MG/ML IJ SOLN: 8.0000 mg | Freq: Once | INTRAMUSCULAR | Status: DC

## 2020-11-30 SURGICAL SUPPLY — 55 items
BAG COUNTER SPONGE SURGICOUNT (BAG) IMPLANT
BAG SPEC THK2 15X12 ZIP CLS (MISCELLANEOUS) ×1
BAG SPNG CNTER NS LX DISP (BAG)
BAG SURGICOUNT SPONGE COUNTING (BAG)
BAG ZIPLOCK 12X15 (MISCELLANEOUS) ×3 IMPLANT
BLADE SAG 18X100X1.27 (BLADE) ×3 IMPLANT
BLADE SAW SGTL 11.0X1.19X90.0M (BLADE) ×3 IMPLANT
BNDG ELASTIC 6X5.8 VLCR STR LF (GAUZE/BANDAGES/DRESSINGS) ×3 IMPLANT
BOWL SMART MIX CTS (DISPOSABLE) ×3 IMPLANT
CEMENT HV SMART SET (Cement) ×6 IMPLANT
CEMENT TIBIA MBT SIZE 2.5 (Knees) ×1 IMPLANT
CLOSURE WOUND 1/2 X4 (GAUZE/BANDAGES/DRESSINGS) ×2
COVER SURGICAL LIGHT HANDLE (MISCELLANEOUS) ×3 IMPLANT
CUFF TOURN SGL QUICK 34 (TOURNIQUET CUFF) ×3
CUFF TRNQT CYL 34X4.125X (TOURNIQUET CUFF) ×1 IMPLANT
DECANTER SPIKE VIAL GLASS SM (MISCELLANEOUS) ×3 IMPLANT
DRAPE U-SHAPE 47X51 STRL (DRAPES) ×3 IMPLANT
DRSG AQUACEL AG ADV 3.5X10 (GAUZE/BANDAGES/DRESSINGS) ×3 IMPLANT
DURAPREP 26ML APPLICATOR (WOUND CARE) ×3 IMPLANT
ELECT REM PT RETURN 15FT ADLT (MISCELLANEOUS) ×3 IMPLANT
FEMUR SIGMA PS SZ 3.0 R (Femur) ×3 IMPLANT
GLOVE SRG 8 PF TXTR STRL LF DI (GLOVE) ×1 IMPLANT
GLOVE SURG ENC MOIS LTX SZ6.5 (GLOVE) ×3 IMPLANT
GLOVE SURG ENC MOIS LTX SZ8 (GLOVE) ×6 IMPLANT
GLOVE SURG UNDER POLY LF SZ7 (GLOVE) ×3 IMPLANT
GLOVE SURG UNDER POLY LF SZ8 (GLOVE) ×3
GLOVE SURG UNDER POLY LF SZ8.5 (GLOVE) ×3 IMPLANT
GOWN STRL REUS W/TWL LRG LVL3 (GOWN DISPOSABLE) ×6 IMPLANT
GOWN STRL REUS W/TWL XL LVL3 (GOWN DISPOSABLE) ×3 IMPLANT
HANDPIECE INTERPULSE COAX TIP (DISPOSABLE) ×3
HOLDER FOLEY CATH W/STRAP (MISCELLANEOUS) IMPLANT
IMMOBILIZER KNEE 20 (SOFTGOODS) ×3
IMMOBILIZER KNEE 20 THIGH 36 (SOFTGOODS) ×1 IMPLANT
INSERT TIBIAL PFC SIG SZ3 10MM (Knees) ×3 IMPLANT
KIT TURNOVER KIT A (KITS) ×3 IMPLANT
MANIFOLD NEPTUNE II (INSTRUMENTS) ×3 IMPLANT
NS IRRIG 1000ML POUR BTL (IV SOLUTION) ×3 IMPLANT
PACK TOTAL KNEE CUSTOM (KITS) ×3 IMPLANT
PADDING CAST COTTON 6X4 STRL (CAST SUPPLIES) ×6 IMPLANT
PATELLA DOME PFC 32MM (Knees) ×3 IMPLANT
PENCIL SMOKE EVACUATOR (MISCELLANEOUS) ×3 IMPLANT
PIN STEINMAN FIXATION KNEE (PIN) ×3 IMPLANT
PROTECTOR NERVE ULNAR (MISCELLANEOUS) ×3 IMPLANT
SET HNDPC FAN SPRY TIP SCT (DISPOSABLE) ×1 IMPLANT
STRIP CLOSURE SKIN 1/2X4 (GAUZE/BANDAGES/DRESSINGS) ×4 IMPLANT
SUT MNCRL AB 4-0 PS2 18 (SUTURE) ×3 IMPLANT
SUT STRATAFIX 0 PDS 27 VIOLET (SUTURE) ×3
SUT VIC AB 2-0 CT1 27 (SUTURE) ×9
SUT VIC AB 2-0 CT1 TAPERPNT 27 (SUTURE) ×3 IMPLANT
SUTURE STRATFX 0 PDS 27 VIOLET (SUTURE) ×1 IMPLANT
TIBIA MBT CEMENT SIZE 2.5 (Knees) ×3 IMPLANT
TRAY FOLEY MTR SLVR 16FR STAT (SET/KITS/TRAYS/PACK) ×3 IMPLANT
TUBE SUCTION HIGH CAP CLEAR NV (SUCTIONS) ×3 IMPLANT
WATER STERILE IRR 1000ML POUR (IV SOLUTION) ×6 IMPLANT
WRAP KNEE MAXI GEL POST OP (GAUZE/BANDAGES/DRESSINGS) ×3 IMPLANT

## 2020-11-30 NOTE — Progress Notes (Signed)
Orthopedic Tech Progress Note Patient Details:  Kathryn [name] 1941-02-26 [id number]  Patient ID: Kathryn [name], female   DOB: [birth year], 80 y.o.   MRN: [id number]  Kathryn [name] 11/30/2020, 3:10 PM Cpm removed @1510 

## 2020-11-30 NOTE — Anesthesia Procedure Notes (Signed)
Spinal  Patient location during procedure: OR Start time: 11/30/2020 9:14 AM End time: 11/30/2020 9:19 AM Reason for block: surgical anesthesia Staffing Performed: anesthesiologist  Anesthesiologist: Cecile Hearing, MD Preanesthetic Checklist Completed: patient identified, IV checked, risks and benefits discussed, surgical consent, monitors and equipment checked, pre-op evaluation and timeout performed Spinal Block Patient position: sitting Prep: DuraPrep and site prepped and draped Patient monitoring: continuous pulse ox and blood pressure Approach: midline Location: L3-4 Injection technique: single-shot Needle Needle type: Pencan  Needle gauge: 24 G Assessment Events: CSF return and second provider Additional Notes Functioning IV was confirmed and monitors were applied. Sterile prep and drape, including hand hygiene, mask and sterile gloves were used. The patient was positioned and the spine was prepped. The skin was anesthetized with lidocaine.  Free flow of clear CSF was obtained prior to injecting local anesthetic into the CSF.  The spinal needle aspirated freely following injection.  The needle was carefully withdrawn.  The patient tolerated the procedure well. Consent was obtained prior to procedure with all questions answered and concerns addressed. Risks including but not limited to bleeding, infection, nerve damage, paralysis, failed block, inadequate analgesia, allergic reaction, high spinal, itching and headache were discussed and the patient wished to proceed.   Arrie Aran, MD

## 2020-11-30 NOTE — Anesthesia Procedure Notes (Signed)
Anesthesia Regional Block: Adductor canal block   Pre-Anesthetic Checklist: , timeout performed,  Correct Patient, Correct Site, Correct Laterality,  Correct Procedure, Correct Position, site marked,  Risks and benefits discussed,  Surgical consent,  Pre-op evaluation,  At surgeon's request and post-op pain management  Laterality: Right  Prep: chloraprep       Needles:  Injection technique: Single-shot  Needle Type: Echogenic Needle     Needle Length: 9cm  Needle Gauge: 21     Additional Needles:   Procedures:,,,, ultrasound used (permanent image in chart),,    Narrative:  Start time: 11/30/2020 8:23 AM End time: 11/30/2020 8:30 AM Injection made incrementally with aspirations every 5 mL.  Performed by: Personally  Anesthesiologist: Cecile Hearing, MD  Additional Notes: No pain on injection. No increased resistance to injection. Injection made in 5cc increments.  Good needle visualization.  Patient tolerated procedure well.

## 2020-11-30 NOTE — Anesthesia Preprocedure Evaluation (Addendum)
Anesthesia Evaluation  Patient identified by MRN, date of birth, ID band Patient awake    Reviewed: Allergy & Precautions, NPO status , Patient's Chart, lab work & pertinent test results, reviewed documented beta blocker date and time   History of Anesthesia Complications (+) PONV and history of anesthetic complications  Airway Mallampati: II  TM Distance: >3 FB Neck ROM: Full    Dental  (+) Teeth Intact, Dental Advisory Given Upper and lower permanent bridges:   Pulmonary neg pulmonary ROS,    Pulmonary exam normal breath sounds clear to auscultation       Cardiovascular hypertension, Pt. on medications and Pt. on home beta blockers Normal cardiovascular exam+ dysrhythmias Atrial Fibrillation  Rhythm:Regular Rate:Normal     Neuro/Psych negative neurological ROS  negative psych ROS   GI/Hepatic Neg liver ROS, GERD  Medicated,  Endo/Other  Hypothyroidism   Renal/GU negative Renal ROS     Musculoskeletal  (+) Arthritis , right knee osteoarthritis   Abdominal   Peds  Hematology negative hematology ROS (+)   Anesthesia Other Findings Day of surgery medications reviewed with the patient.  Reproductive/Obstetrics                            Anesthesia Physical Anesthesia Plan  ASA: 3  Anesthesia Plan: Spinal   Post-op Pain Management:  Regional for Post-op pain   Induction:   PONV Risk Score and Plan: 3 and Propofol infusion, Treatment may vary due to age or medical condition, Dexamethasone and Ondansetron  Airway Management Planned: Nasal Cannula and Natural Airway  Additional Equipment:   Intra-op Plan:   Post-operative Plan:   Informed Consent: I have reviewed the patients History and Physical, chart, labs and discussed the procedure including the risks, benefits and alternatives for the proposed anesthesia with the patient or authorized representative who has indicated his/her  understanding and acceptance.     Dental advisory given  Plan Discussed with: CRNA, Anesthesiologist and Surgeon  Anesthesia Plan Comments:         Anesthesia Quick Evaluation

## 2020-11-30 NOTE — Progress Notes (Signed)
Assisted Dr. Turk with right, ultrasound guided, adductor canal block. Side rails up, monitors on throughout procedure. See vital signs in flow sheet. Tolerated Procedure well.  

## 2020-11-30 NOTE — Interval H&P Note (Signed)
History and Physical Interval Note:  11/30/2020 7:10 AM  Kathryn [name]  has presented today for surgery, with the diagnosis of right knee osteoarthritis.  The various methods of treatment have been discussed with the patient and family. After consideration of risks, benefits and other options for treatment, the patient has consented to  Procedure(s) with comments: TOTAL KNEE ARTHROPLASTY (Right) - as a surgical intervention.  The patient's history has been reviewed, patient examined, no change in status, stable for surgery.  I have reviewed the patient's chart and labs.  Questions were answered to the patient's satisfaction.     Kathryn [name]

## 2020-11-30 NOTE — Progress Notes (Signed)
Orthopedic Tech Progress Note Patient Details:  Kathryn [name] 1941-02-06 [id number]  Patient ID: Kathryn [name], female   DOB: 1940-10-19, 80 y.o.   MRN: [id number]  Kathryn [name] 11/30/2020, 11:12 AM Cpm applied in pacu

## 2020-11-30 NOTE — Anesthesia Postprocedure Evaluation (Signed)
Anesthesia Post Note  Patient: Kathryn [name]  Procedure(s) Performed: TOTAL KNEE ARTHROPLASTY (Right: Knee)     Patient location during evaluation: PACU Anesthesia Type: Spinal Level of consciousness: oriented, awake and alert and awake Pain management: pain level controlled Vital Signs Assessment: post-procedure vital signs reviewed and stable Respiratory status: spontaneous breathing, respiratory function stable and nonlabored ventilation Cardiovascular status: blood pressure returned to baseline and stable Postop Assessment: no headache, no backache, no apparent nausea or vomiting and spinal receding Anesthetic complications: no   No notable events documented.  Last Vitals:  Vitals:   11/30/20 1425 11/30/20 1631  BP: (!) 154/77 (!) 159/73  Pulse: (!) 52 (!) 53  Resp: 16 18  Temp: 36.6 C   SpO2: 98% 99%    Last Pain:  Vitals:   11/30/20 1500  TempSrc:   PainSc: 0-No pain                 Cecile Hearing

## 2020-11-30 NOTE — Evaluation (Signed)
Physical Therapy Evaluation Patient Details Name: Kathryn [name] MRN: [id number] DOB: [birth year] Today's Date: 11/30/2020   History of Present Illness  Patient is 80 y.o. female s/p Rt TKA on 11/30/20 with PMH significant for OA, HTN, Hypothyroidism, Rt THA in 2017.    Clinical Impression  FATIN BACHICHA is a 80 y.o. female POD 0 s/p Rt TKA. Patient reports independent with mobility at baseline. Patient is now limited by functional impairments (see PT problem list below) and requires min assist for transfers and gait with RW. Patient was able to ambulate ~14 feet with RW and min assist; limited distance due to pain. Patient instructed in exercise to facilitate circulation to manage edema and reduce risk of DVT. Patient will benefit from continued skilled PT interventions to address impairments and progress towards PLOF. Acute PT will follow to progress mobility and stair training in preparation for safe discharge home.     Follow Up Recommendations Follow surgeon's recommendation for DC plan and follow-up therapies    Equipment Recommendations  None recommended by PT    Recommendations for Other Services       Precautions / Restrictions Precautions Precautions: Fall Restrictions Weight Bearing Restrictions: No Other Position/Activity Restrictions: WBAT      Mobility  Bed Mobility Overal bed mobility: Needs Assistance Bed Mobility: Supine to Sit     Supine to sit: Min assist;HOB elevated     General bed mobility comments: cues to use bed rail, assist to bring Rt LE off EOB and extra time needed for pt to scoot forward.    Transfers Overall transfer level: Needs assistance Equipment used: Rolling walker (2 wheeled) Transfers: Sit to/from Stand Sit to Stand: Min assist         General transfer comment: cues for hand placement and assist to complete power up and steady once standing.  Ambulation/Gait Ambulation/Gait assistance: Min assist Gait Distance (Feet): 14  Feet Assistive device: Rolling walker (2 wheeled) Gait Pattern/deviations: Step-to pattern;Decreased stride length;Decreased weight shift to right;Antalgic Gait velocity: decr   General Gait Details: cues for step pattern and position of walker. walker too tall for pt and initially pt had difficulty reducing weight on Rt LE but improved with each step. (youth walker in room at [facility]) immobilizer in place due to weakness  Stairs            Wheelchair Mobility    Modified Rankin (Stroke Patients Only)       Balance Overall balance assessment: Needs assistance Sitting-balance support: Feet supported Sitting balance-Leahy Scale: Good     Standing balance support: During functional activity;Bilateral upper extremity supported Standing balance-Leahy Scale: Poor                               Pertinent Vitals/Pain Pain Assessment: 0-10 Pain Score: 0-No pain Pain Location: Rt knee Pain Intervention(s): Monitored during session;Repositioned;Ice applied    Home Living Family/patient expects to be discharged to:: Private residence Living Arrangements: Alone Available Help at Discharge: Family Type of Home: House Home Access: Stairs to enter Entrance Stairs-Rails: Right Entrance Stairs-Number of Steps: 1+1 Home Layout: One level Home Equipment: Environmental consultant - 2 wheels;Cane - single point;Bedside commode;Shower seat Additional Comments: daughter staying for ~2 weeks    Prior Function Level of Independence: Independent with assistive device(s)               Hand Dominance   Dominant Hand: Right    Extremity/Trunk Assessment  Upper Extremity Assessment Upper Extremity Assessment: Overall WFL for tasks assessed    Lower Extremity Assessment Lower Extremity Assessment: Generalized weakness;RLE deficits/detail RLE Deficits / Details: pt with limited quad acitvation and knee immobilizer used to prevent buckling RLE Sensation: WNL RLE Coordination: WNL     Cervical / Trunk Assessment Cervical / Trunk Assessment: Normal  Communication   Communication: HOH  Cognition Arousal/Alertness: Awake/alert Behavior During Therapy: WFL for tasks assessed/performed Overall Cognitive Status: Within Functional Limits for tasks assessed                                        General Comments      Exercises Total Joint Exercises Ankle Circles/Pumps: AROM;Both;20 reps;Seated   Assessment/Plan    PT Assessment Patient needs continued PT services  PT Problem List Decreased strength;Decreased range of motion;Decreased activity tolerance;Decreased balance;Decreased mobility;Decreased knowledge of use of DME;Decreased knowledge of precautions;Pain       PT Treatment Interventions DME instruction;Gait training;Stair training;Functional mobility training;Therapeutic activities;Therapeutic exercise;Balance training;Patient/family education    PT Goals (Current goals can be found in the Care Plan section)  Acute Rehab PT Goals Patient Stated Goal: get back independence and get rid of bakers cyst PT Goal Formulation: With patient Time For Goal Achievement: 12/07/20 Potential to Achieve Goals: Good    Frequency 7X/week   Barriers to discharge        Co-evaluation               AM-PAC PT "6 Clicks" Mobility  Outcome Measure Help needed turning from your back to your side while in a flat bed without using bedrails?: A Little Help needed moving from lying on your back to sitting on the side of a flat bed without using bedrails?: A Little Help needed moving to and from a bed to a chair (including a wheelchair)?: A Little Help needed standing up from a chair using your arms (e.g., wheelchair or bedside chair)?: A Little Help needed to walk in hospital room?: A Little Help needed climbing 3-5 steps with a railing? : A Lot 6 Click Score: 17    End of Session Equipment Utilized During Treatment: Gait belt;Right knee  immobilizer Activity Tolerance: Patient tolerated treatment well Patient left: in chair;with call bell/phone within reach;with chair alarm set;with family/visitor present Nurse Communication: Mobility status PT Visit Diagnosis: Muscle weakness (generalized) (M62.81);Difficulty in walking, not elsewhere classified (R26.2)    Time: 8184-0375 PT Time Calculation (min) (ACUTE ONLY): 21 min   Charges:   PT Evaluation $PT Eval Low Complexity: 1 Low          Wynn Maudlin, DPT Acute Rehabilitation Services Office [phone] Pager [phone]   AINO HECKERT 11/30/2020, 6:44 PM

## 2020-11-30 NOTE — Transfer of Care (Signed)
Immediate Anesthesia Transfer of Care Note  Patient: Kathryn [name]  Procedure(s) Performed: TOTAL KNEE ARTHROPLASTY (Right: Knee)  Patient Location: PACU  Anesthesia Type:Spinal  Level of Consciousness: awake, alert , oriented, drowsy and patient cooperative  Airway & Oxygen Therapy: Patient Spontanous Breathing and Patient connected to face mask oxygen  Post-op Assessment: Report given to RN and Post -op Vital signs reviewed and stable  Post vital signs: Reviewed and stable  Last Vitals:  Vitals Value Taken Time  BP 93/73 11/30/20 1034  Temp    Pulse 50 11/30/20 1035  Resp 16 11/30/20 1035  SpO2 99 % 11/30/20 1035  Vitals shown include unvalidated device data.  Last Pain:  Vitals:   11/30/20 0900  TempSrc:   PainSc: 0-No pain      Patients Stated Pain Goal: 4 (11/30/20 0723)  Complications: No notable events documented.

## 2020-11-30 NOTE — Discharge Instructions (Addendum)
 Frank Aluisio, MD Total Joint Specialist [facility] [address] [place], [place] [address] [phone]  TOTAL KNEE REPLACEMENT POSTOPERATIVE DIRECTIONS    Knee Rehabilitation, Guidelines Following Surgery  Results after knee surgery are often greatly improved when you follow the exercise, range of motion and muscle strengthening exercises prescribed by your doctor. Safety measures are also important to protect the knee from further injury. If any of these exercises cause you to have increased pain or swelling in your knee joint, decrease the amount until you are comfortable again and slowly increase them. If you have problems or questions, call your caregiver or physical therapist for advice.   BLOOD CLOT PREVENTION Take a 10 mg Xarelto once a day for three weeks following surgery. Then resume one 81 mg aspirin once a day. You may resume your vitamins/supplements once you have discontinued the Xarelto. Do not take any NSAIDs (Advil, Aleve, Ibuprofen, Meloxicam, etc.) until you have discontinued the Xarelto.   HOME CARE INSTRUCTIONS  Remove items at home which could result in a fall. This includes throw rugs or furniture in walking pathways.  ICE to the affected knee as much as tolerated. Icing helps control swelling. If the swelling is well controlled you will be more comfortable and rehab easier. Continue to use ice on the knee for pain and swelling from surgery. You may notice swelling that will progress down to the foot and ankle. This is normal after surgery. Elevate the leg when you are not up walking on it.    Continue to use the breathing machine which will help keep your temperature down. It is common for your temperature to cycle up and down following surgery, especially at night when you are not up moving around and exerting yourself. The breathing machine keeps your lungs expanded and your temperature down. Do not place pillow under the operative  knee, focus on keeping the knee straight while resting  DIET You may resume your previous home diet once you are discharged from the hospital.  DRESSING / WOUND CARE / SHOWERING Keep your bulky bandage on for 2 days. On the third post-operative day you may remove the Ace bandage and gauze. There is a waterproof adhesive bandage on your skin which will stay in place until your first follow-up appointment. Once you remove this you will not need to place another bandage You may begin showering 3 days following surgery, but do not submerge the incision under water.  ACTIVITY For the first 5 days, the key is rest and control of pain and swelling Do your home exercises twice a day starting on post-operative day 3. On the days you go to physical therapy, just do the home exercises once that day. You should rest, ice and elevate the leg for 50 minutes out of every hour. Get up and walk/stretch for 10 minutes per hour. After 5 days you can increase your activity slowly as tolerated. Walk with your walker as instructed. Use the walker until you are comfortable transitioning to a cane. Walk with the cane in the opposite hand of the operative leg. You may discontinue the cane once you are comfortable and walking steadily. Avoid periods of inactivity such as sitting longer than an hour when not asleep. This helps prevent blood clots.  You may discontinue the knee immobilizer once you are able to perform a straight leg raise while lying down. You may resume a sexual relationship in one month or when given the OK by your   doctor.  You may return to work once you are cleared by your doctor.  Do not drive a car for 6 weeks or until released by your surgeon.  Do not drive while taking narcotics.  TED HOSE STOCKINGS Wear the elastic stockings on both legs for three weeks following surgery during the day. You may remove them at night for sleeping.  WEIGHT BEARING Weight bearing as tolerated with assist device  (walker, cane, etc) as directed, use it as long as suggested by your surgeon or therapist, typically at least 4-6 weeks.  POSTOPERATIVE CONSTIPATION PROTOCOL Constipation - defined medically as fewer than three stools per week and severe constipation as less than one stool per week.  One of the most common issues patients have following surgery is constipation.  Even if you have a regular bowel pattern at home, your normal regimen is likely to be disrupted due to multiple reasons following surgery.  Combination of anesthesia, postoperative narcotics, change in appetite and fluid intake all can affect your bowels.  In order to avoid complications following surgery, here are some recommendations in order to help you during your recovery period.  Colace (docusate) - Pick up an over-the-counter form of Colace or another stool softener and take twice a day as long as you are requiring postoperative pain medications.  Take with a full glass of water daily.  If you experience loose stools or diarrhea, hold the colace until you stool forms back up. If your symptoms do not get better within 1 week or if they get worse, check with your doctor. Dulcolax (bisacodyl) - Pick up over-the-counter and take as directed by the product packaging as needed to assist with the movement of your bowels.  Take with a full glass of water.  Use this product as needed if not relieved by Colace only.  MiraLax (polyethylene glycol) - Pick up over-the-counter to have on hand. MiraLax is a solution that will increase the amount of water in your bowels to assist with bowel movements.  Take as directed and can mix with a glass of water, juice, soda, coffee, or tea. Take if you go more than two days without a movement. Do not use MiraLax more than once per day. Call your doctor if you are still constipated or irregular after using this medication for 7 days in a row.  If you continue to have problems with postoperative constipation, please  contact the office for further assistance and recommendations.  If you experience "the worst abdominal pain ever" or develop nausea or vomiting, please contact the office immediatly for further recommendations for treatment.  ITCHING If you experience itching with your medications, try taking only a single pain pill, or even half a pain pill at a time.  You can also use Benadryl over the counter for itching or also to help with sleep.   MEDICATIONS See your medication summary on the "After Visit Summary" that the nursing staff will review with you prior to discharge.  You may have some home medications which will be placed on hold until you complete the course of blood thinner medication.  It is important for you to complete the blood thinner medication as prescribed by your surgeon.  Continue your approved medications as instructed at time of discharge.  PRECAUTIONS If you experience chest pain or shortness of breath - call 911 immediately for transfer to the hospital emergency department.  If you develop a fever greater that 101 F, purulent drainage from wound, increased redness or   drainage from wound, foul odor from the wound/dressing, or calf pain - CONTACT YOUR SURGEON.                                                   FOLLOW-UP APPOINTMENTS Make sure you keep all of your appointments after your operation with your surgeon and caregivers. You should call the office at the above phone number and make an appointment for approximately two weeks after the date of your surgery or on the date instructed by your surgeon outlined in the "After Visit Summary".  RANGE OF MOTION AND STRENGTHENING EXERCISES  Rehabilitation of the knee is important following a knee injury or an operation. After just a few days of immobilization, the muscles of the thigh which control the knee become weakened and shrink (atrophy). Knee exercises are designed to build up the tone and strength of the thigh muscles and to  improve knee motion. Often times heat used for twenty to thirty minutes before working out will loosen up your tissues and help with improving the range of motion but do not use heat for the first two weeks following surgery. These exercises can be done on a training (exercise) mat, on the floor, on a table or on a bed. Use what ever works the best and is most comfortable for you Knee exercises include:  Leg Lifts - While your knee is still immobilized in a splint or cast, you can do straight leg raises. Lift the leg to 60 degrees, hold for 3 sec, and slowly lower the leg. Repeat 10-20 times 2-3 times daily. Perform this exercise against resistance later as your knee gets better.  Quad and Hamstring Sets - Tighten up the muscle on the front of the thigh (Quad) and hold for 5-10 sec. Repeat this 10-20 times hourly. Hamstring sets are done by pushing the foot backward against an object and holding for 5-10 sec. Repeat as with quad sets.  Leg Slides: Lying on your back, slowly slide your foot toward your buttocks, bending your knee up off the floor (only go as far as is comfortable). Then slowly slide your foot back down until your leg is flat on the floor again. Angel Wings: Lying on your back spread your legs to the side as far apart as you can without causing discomfort.  A rehabilitation program following serious knee injuries can speed recovery and prevent re-injury in the future due to weakened muscles. Contact your doctor or a physical therapist for more information on knee rehabilitation.   POST-OPERATIVE OPIOID TAPER INSTRUCTIONS: It is important to wean off of your opioid medication as soon as possible. If you do not need pain medication after your surgery it is ok to stop day one. Opioids include: Codeine, Hydrocodone(Norco, Vicodin), Oxycodone(Percocet, oxycontin) and hydromorphone amongst others.  Long term and even short term use of opiods can cause: Increased pain  response Dependence Constipation Depression Respiratory depression And more.  Withdrawal symptoms can include Flu like symptoms Nausea, vomiting And more Techniques to manage these symptoms Hydrate well Eat regular healthy meals Stay active Use relaxation techniques(deep breathing, meditating, yoga) Do Not substitute Alcohol to help with tapering If you have been on opioids for less than two weeks and do not have pain than it is ok to stop all together.  Plan to wean off of opioids This plan   should start within one week post op of your joint replacement. Maintain the same interval or time between taking each dose and first decrease the dose.  Cut the total daily intake of opioids by one tablet each day Next start to increase the time between doses. The last dose that should be eliminated is the evening dose.   IF YOU ARE TRANSFERRED TO A SKILLED REHAB FACILITY If the patient is transferred to a skilled rehab facility following release from the hospital, a list of the current medications will be sent to the facility for the patient to continue.  When discharged from the skilled rehab facility, please have the facility set up the patient's Home Health Physical Therapy prior to being released. Also, the skilled facility will be responsible for providing the patient with their medications at time of release from the facility to include their pain medication, the muscle relaxants, and their blood thinner medication. If the patient is still at the rehab facility at time of the two week follow up appointment, the skilled rehab facility will also need to assist the patient in arranging follow up appointment in our office and any transportation needs.  MAKE SURE YOU:  Understand these instructions.  Get help right away if you are not doing well or get worse.   DENTAL ANTIBIOTICS:  In most cases prophylactic antibiotics for Dental procdeures after total joint surgery are not  necessary.  Exceptions are as follows:  1. History of prior total joint infection  2. Severely immunocompromised (Organ Transplant, cancer chemotherapy, Rheumatoid biologic meds such as Humera)  3. Poorly controlled diabetes (A1C &gt; 8.0, blood glucose over 200)  If you have one of these conditions, contact your surgeon for an antibiotic prescription, prior to your dental procedure.    Pick up stool softner and laxative for home use following surgery while on pain medications. Do not submerge incision under water. Please use good hand washing techniques while changing dressing each day. May shower starting three days after surgery. Please use a clean towel to pat the incision dry following showers. Continue to use ice for pain and swelling after surgery. Do not use any lotions or creams on the incision until instructed by your surgeon.      Information on my medicine - XARELTO (Rivaroxaban)  Why was Xarelto prescribed for you? Xarelto was prescribed for you to reduce the risk of blood clots forming after orthopedic surgery. The medical term for these abnormal blood clots is venous thromboembolism (VTE).  What do you need to know about xarelto ? Take your Xarelto ONCE DAILY at the same time every day. You may take it either with or without food.  If you have difficulty swallowing the tablet whole, you may crush it and mix in applesauce just prior to taking your dose.  Take Xarelto exactly as prescribed by your doctor and DO NOT stop taking Xarelto without talking to the doctor who prescribed the medication.  Stopping without other VTE prevention medication to take the place of Xarelto may increase your risk of developing a clot.  After discharge, you should have regular check-up appointments with your healthcare provider that is prescribing your Xarelto.    What do you do if you miss a dose? If you miss a dose, take it as soon as you remember on the same day then  continue your regularly scheduled once daily regimen the next day. Do not take two doses of Xarelto on the same day.   Important Safety Information   A possible side effect of Xarelto is bleeding. You should call your healthcare provider right away if you experience any of the following: Bleeding from an injury or your nose that does not stop. Unusual colored urine (red or dark [place]) or unusual colored stools (red or black). Unusual bruising for unknown reasons. A serious fall or if you hit your head (even if there is no bleeding).  Some medicines may interact with Xarelto and might increase your risk of bleeding while on Xarelto. To help avoid this, consult your healthcare provider or pharmacist prior to using any new prescription or non-prescription medications, including herbals, vitamins, non-steroidal anti-inflammatory drugs (NSAIDs) and supplements.  This website has more information on Xarelto: www.xarelto.com.   

## 2020-11-30 NOTE — Op Note (Signed)
OPERATIVE REPORT-TOTAL KNEE ARTHROPLASTY   Pre-operative diagnosis- Osteoarthritis  Right knee(s)  Post-operative diagnosis- Osteoarthritis Right knee(s)  Procedure-  Right  Total Knee Arthroplasty  Surgeon- Gus Rankin. Calissa Swenor, MD  Assistant- Leilani Able, PA-C   Anesthesia-   Adductor canal block and spinal  EBL-10 mL   Drains None  Tourniquet time-  Total Tourniquet Time Documented: Thigh (Right) - 28 minutes Total: Thigh (Right) - 28 minutes     Complications- None  Condition-PACU - hemodynamically stable.   Brief Clinical Note  Kathryn [name] is a 80 y.o. year old female with end stage OA of her right knee with progressively worsening pain and dysfunction. She has constant pain, with activity and at rest and significant functional deficits with difficulties even with ADLs. She has had extensive non-op management including analgesics, injections of cortisone and viscosupplements, and home exercise program, but remains in significant pain with significant dysfunction.Radiographs show bone on bone arthritis medial and patellofemoral. She presents now for right Total Knee Arthroplasty.     Procedure in detail---   The patient is brought into the operating room and positioned supine on the operating table. After successful administration of  Adductor canal block and spinal,   a tourniquet is placed high on the  Right thigh(s) and the lower extremity is prepped and draped in the usual sterile fashion. Time out is performed by the operating team and then the  Right lower extremity is wrapped in Esmarch, knee flexed and the tourniquet inflated to 300 mmHg.       A midline incision is made with a ten blade through the subcutaneous tissue to the level of the extensor mechanism. A fresh blade is used to make a medial parapatellar arthrotomy. Soft tissue over the proximal medial tibia is subperiosteally elevated to the joint line with a knife and into the semimembranosus bursa with a Cobb  elevator. Soft tissue over the proximal lateral tibia is elevated with attention being paid to avoiding the patellar tendon on the tibial tubercle. The patella is everted, knee flexed 90 degrees and the ACL and PCL are removed. Findings are bone on bone medial and patellofemoral with large global osteophytes.        The drill is used to create a starting hole in the distal femur and the canal is thoroughly irrigated with sterile saline to remove the fatty contents. The 5 degree Right  valgus alignment guide is placed into the femoral canal and the distal femoral cutting block is pinned to remove 9 mm off the distal femur. Resection is made with an oscillating saw.      The tibia is subluxed forward and the menisci are removed. The extramedullary alignment guide is placed referencing proximally at the medial aspect of the tibial tubercle and distally along the second metatarsal axis and tibial crest. The block is pinned to remove 47mm off the more deficient medial  side. Resection is made with an oscillating saw. Size 2.5is the most appropriate size for the tibia and the proximal tibia is prepared with the modular drill and keel punch for that size.      The femoral sizing guide is placed and size 3 is most appropriate. Rotation is marked off the epicondylar axis and confirmed by creating a rectangular flexion gap at 90 degrees. The size 3 cutting block is pinned in this rotation and the anterior, posterior and chamfer cuts are made with the oscillating saw. The intercondylar block is then placed and that cut is made.  Trial size 2.5 tibial component, trial size 3 posterior stabilized femur and a 10  mm posterior stabilized rotating platform insert trial is placed. Full extension is achieved with excellent varus/valgus and anterior/posterior balance throughout full range of motion. The patella is everted and thickness measured to be 21  mm. Free hand resection is taken to 12 mm, a 32 template is placed, lug  holes are drilled, trial patella is placed, and it tracks normally. Osteophytes are removed off the posterior femur with the trial in place. All trials are removed and the cut bone surfaces prepared with pulsatile lavage. Cement is mixed and once ready for implantation, the size 2.5 tibial implant, size  3 posterior stabilized femoral component, and the size 32 patella are cemented in place and the patella is held with the clamp. The trial insert is placed and the knee held in full extension. The Exparel (20 ml mixed with 60 ml saline) is injected into the extensor mechanism, posterior capsule, medial and lateral gutters and subcutaneous tissues.  All extruded cement is removed and once the cement is hard the permanent 10 mm posterior stabilized rotating platform insert is placed into the tibial tray.      The wound is copiously irrigated with saline solution and the extensor mechanism closed with # 0 Stratofix suture. The tourniquet is released for a total tourniquet time of 28  minutes. Flexion against gravity is 140 degrees and the patella tracks normally. Subcutaneous tissue is closed with 2.0 vicryl and subcuticular with running 4.0 Monocryl. The incision is cleaned and dried and steri-strips and a bulky sterile dressing are applied. The limb is placed into a knee immobilizer and the patient is awakened and transported to recovery in stable condition.      Please note that a surgical assistant was a medical necessity for this procedure in order to perform it in a safe and expeditious manner. Surgical assistant was necessary to retract the ligaments and vital neurovascular structures to prevent injury to them and also necessary for proper positioning of the limb to allow for anatomic placement of the prosthesis.   Gus Rankin Kathryn Dais, MD    11/30/2020, 10:12 AM

## 2020-12-01 DIAGNOSIS — M1711 Unilateral primary osteoarthritis, right knee: Secondary | ICD-10-CM | POA: Diagnosis not present

## 2020-12-01 LAB — CBC
HCT: 34.2 % — ABNORMAL LOW (ref 36.0–46.0)
Hemoglobin: 11.9 g/dL — ABNORMAL LOW (ref 12.0–15.0)
MCH: 34.7 pg — ABNORMAL HIGH (ref 26.0–34.0)
MCHC: 34.8 g/dL (ref 30.0–36.0)
MCV: 99.7 fL (ref 80.0–100.0)
Platelets: 245 10*3/uL (ref 150–400)
RBC: 3.43 MIL/uL — ABNORMAL LOW (ref 3.87–5.11)
RDW: 11.3 % — ABNORMAL LOW (ref 11.5–15.5)
WBC: 10 10*3/uL (ref 4.0–10.5)
nRBC: 0 % (ref 0.0–0.2)

## 2020-12-01 LAB — BASIC METABOLIC PANEL
Anion gap: 10 (ref 5–15)
BUN: 11 mg/dL (ref 8–23)
CO2: 27 mmol/L (ref 22–32)
Calcium: 7.9 mg/dL — ABNORMAL LOW (ref 8.9–10.3)
Chloride: 97 mmol/L — ABNORMAL LOW (ref 98–111)
Creatinine, Ser: 0.69 mg/dL (ref 0.44–1.00)
GFR, Estimated: >60 mL/min (ref >60)
Glucose, Bld: 153 mg/dL — ABNORMAL HIGH (ref 70–99)
Potassium: 4.3 mmol/L (ref 3.5–5.1)
Sodium: 134 mmol/L — ABNORMAL LOW (ref 135–145)

## 2020-12-01 MED ORDER — OXYCODONE HCL 5 MG PO TABS: 5.0000 mg | ORAL_TABLET | Freq: Four times a day (QID) | ORAL | 0 refills | Status: DC | PRN

## 2020-12-01 MED ORDER — METHOCARBAMOL 500 MG PO TABS: 500.0000 mg | ORAL_TABLET | Freq: Four times a day (QID) | ORAL | 0 refills | Status: DC | PRN

## 2020-12-01 MED ORDER — RIVAROXABAN 10 MG PO TABS: 10.0000 mg | ORAL_TABLET | Freq: Every day | ORAL | 0 refills | Status: AC

## 2020-12-01 NOTE — TOC Transition Note (Signed)
Transition of Care [facility]) - CM/SW Discharge Note  Patient Details  Name: Kathryn [name] MRN: [id number] Date of Birth: 09-23-1940  [facility]) CM/SW Contact:  Sherie Don, LCSW Phone Number: 12/01/2020, 9:38 AM  Clinical Narrative: Patient is expected to discharge home today after working with PT. CSW met with patient to confirm discharge plan. Patient will discharge home with OPPT at [facility] in [place]. Patient has a rolling walker, 3N1, and high toilets at home so there are no DME needs at this time. TOC signing off.  Final next level of care: OP Rehab Barriers to Discharge: No Barriers Identified  Patient Goals and CMS Choice Patient states their goals for this hospitalization and ongoing recovery are:: Discharge home with [place] CMS Medicare.gov Compare Post Acute Care list provided to:: Patient Choice offered to / list presented to : NA  Discharge Plan and Services        DME Arranged: N/A DME Agency: NA  Readmission Risk Interventions No flowsheet data found.

## 2020-12-01 NOTE — Progress Notes (Signed)
   Subjective: 1 Day Post-Op Procedure(s) (LRB): TOTAL KNEE ARTHROPLASTY (Right) Patient reports pain as mild.   Patient seen in rounds by Dr. Lequita Halt. Patient is well, and has had no acute complaints or problems other than pain in the right knee. No issues overnight. Denies chest pain or SOB, foley catheter removed this AM. We will continue therapy today.   Objective: Vital signs in last 24 hours: Temp:  [96.7 F (35.9 C)-97.8 F (36.6 C)] 97.7 F (36.5 C) (08/09 0530) Pulse Rate:  [43-74] 74 (08/09 0530) Resp:  [8-19] 14 (08/09 0530) BP: (93-159)/(55-77) 124/63 (08/09 0530) SpO2:  [93 %-100 %] 99 % (08/09 0530)  Intake/Output from previous day:  Intake/Output Summary (Last 24 hours) at 12/01/2020 0748 Last data filed at 12/01/2020 0600 Gross per 24 hour  Intake 4240 ml  Output 1960 ml  Net 2280 ml     Intake/Output this shift: No intake/output data recorded.  Labs: Recent Labs    12/01/20 0326  HGB 11.9*   Recent Labs    12/01/20 0326  WBC 10.0  RBC 3.43*  HCT 34.2*  PLT 245   Recent Labs    12/01/20 0326  NA 134*  K 4.3  CL 97*  CO2 27  BUN 11  CREATININE 0.69  GLUCOSE 153*  CALCIUM 7.9*   No results for input(s): LABPT, INR in the last 72 hours.  Exam: General - Patient is Alert and Oriented Extremity - Neurologically intact Neurovascular intact Sensation intact distally Dorsiflexion/Plantar flexion intact Dressing - dressing C/D/I Motor Function - intact, moving foot and toes well on exam.   Past Medical History:  Diagnosis Date   Arthritis    Dysrhythmia    a fib   Family history of adverse reaction to anesthesia    mother has problems with N/V    Hypertension    Hypothyroidism    PONV (postoperative nausea and vomiting)    Pre-diabetes    HGBa1c high    Assessment/Plan: 1 Day Post-Op Procedure(s) (LRB): TOTAL KNEE ARTHROPLASTY (Right) Principal Problem:   OA (osteoarthritis) of knee Active Problems:   Primary osteoarthritis  of right knee  Estimated body mass index is 23.24 kg/m as calculated from the following:   Height as of this encounter: 5\' 1"  (1.549 m).   Weight as of this encounter: 55.8 kg. Advance diet Up with therapy D/C IV fluids   Patient's anticipated LOS is less than 2 midnights, meeting these requirements: - Younger than 23 - Lives within 1 hour of care - Has a competent adult at home to recover with post-op recover - NO history of  - Chronic pain requiring opiods  - Diabetes  - Coronary Artery Disease  - Heart failure  - Heart attack  - Stroke  - DVT/VTE  - Cardiac arrhythmia  - Respiratory Failure/COPD  - Renal failure  - Anemia  - Advanced Liver disease  DVT Prophylaxis - Xarelto Weight bearing as tolerated. Continue therapy.  Plan is to go Home after hospital stay. Plan for discharge later today if progresses with therapy and meeting her goals. Scheduled for OPPT at [facility] Follow-up in the office in 2 weeks  The PDMP database was reviewed today prior to any opioid medications being prescribed to this patient.  76, PA-C Orthopedic Surgery [phone] 12/01/2020, 7:48 AM

## 2020-12-01 NOTE — Progress Notes (Signed)
Physical Therapy Treatment Patient Details Name: Kathryn [name] MRN: [id number] DOB: Feb 03, 1941 Today's Date: 12/01/2020    History of Present Illness Patient is 80 y.o. female s/p Rt TKA on 11/30/20 with PMH significant for OA, HTN, Hypothyroidism, Rt THA in 2017.    PT Comments    POD # 1 pm session Applied KI and instructed pt to wear today to get into house/up the stairs then remove.   Assisted OOB to amb in hallway an increased distance.  General stair comments: Educated pt to wear her knee immobilizer to go home and navigate stairs. first practiced one step/curb forward with walker 50% VC's on proper walker placement and sequencing.  Second performed 2 steps one right rail at 25% VC's on proper sequenxcing.  Pt did well. Assisted back to bed when son arrived.  Educated him on KI use as well as HEP.  Addressed all mobility questions, discussed appropriate activity, educated on use of ICE.  Pt ready for D/C to home however only voided 100cc per NT.  Notified RN Pt has met her mobility goals.   Follow Up Recommendations  Follow surgeon's recommendation for DC plan and follow-up therapies;Outpatient PT     Equipment Recommendations  None recommended by PT    Recommendations for Other Services       Precautions / Restrictions Precautions Precautions: Fall Precaution Comments: instructed to wear KI for D/C to home/+stairs then remove Required Braces or Orthoses: Knee Immobilizer - Right Knee Immobilizer - Right: On when out of bed or walking Restrictions Weight Bearing Restrictions: No RLE Weight Bearing: Weight bearing as tolerated    Mobility  Bed Mobility Overal bed mobility: Needs Assistance Bed Mobility: Supine to Sit;Sit to Supine     Supine to sit: Supervision;Min guard     General bed mobility comments: demonstarted and instructed how to use belt loop to self assist LE off bed and back onto bed.    Transfers Overall transfer level: Needs assistance Equipment  used: Rolling walker (2 wheeled) Transfers: Sit to/from [facility] Sit to Stand: Supervision Stand pivot transfers: Supervision;Min guard       General transfer comment: 25% VC's to avoid pulling self up on walker and safety with turns.  Ambulation/Gait Ambulation/Gait assistance: Supervision;Min guard Gait Distance (Feet): 65 Feet Assistive device: Rolling walker (2 wheeled) Gait Pattern/deviations: Step-to pattern;Decreased stride length;Decreased weight shift to right;Antalgic Gait velocity: decr   General Gait Details: 25% VC's on proper walker to self distance, proper sequencing as well as safety with turns plus "roll" not "pick up" walker.  Pt tolerated n increased  distance.   Stairs Stairs: Yes   Stair Management: No rails;One rail Right;Step to pattern;Forwards;With walker Number of Stairs: 3 General stair comments: Educated pt to wear her knee immobilizer to go home and navigate stairs. first practiced one step/curb forward with walker 50% VC's on proper walker placement and sequencing.  Second performed 2 steps one right rail at 25% VC's on proper sequenxcing.  Pt did well.   Wheelchair Mobility    Modified Rankin (Stroke Patients Only)       Balance                                            Cognition Arousal/Alertness: Awake/alert Behavior During Therapy: WFL for tasks assessed/performed Overall Cognitive Status: Within Functional Limits for tasks assessed  General Comments: AxO x 3 very pleasant      Exercises      General Comments        Pertinent Vitals/Pain Pain Assessment: 0-10 Pain Score: 4  Pain Location: Rt knee Pain Descriptors / Indicators: Operative site guarding;Discomfort;Tightness Pain Intervention(s): Monitored during session;Repositioned;Ice applied;Premedicated before session    Home Living                      Prior Function             PT Goals (current goals can now be found in the care plan section) Progress towards PT goals: Progressing toward goals    Frequency    7X/week      PT Plan Current plan remains appropriate    Co-evaluation              AM-PAC PT "6 Clicks" Mobility   Outcome Measure  Help needed turning from your back to your side while in a flat bed without using bedrails?: A Little Help needed moving from lying on your back to sitting on the side of a flat bed without using bedrails?: A Little Help needed moving to and from a bed to a chair (including a wheelchair)?: A Little Help needed standing up from a chair using your arms (e.g., wheelchair or bedside chair)?: A Little Help needed to walk in hospital room?: A Little Help needed climbing 3-5 steps with a railing? : A Lot 6 Click Score: 17    End of Session Equipment Utilized During Treatment: Gait belt;Right knee immobilizer Activity Tolerance: Patient tolerated treatment well Patient left: in bed Nurse Communication: Mobility status (pt has met goals to D/C to home today) PT Visit Diagnosis: Muscle weakness (generalized) (M62.81);Difficulty in walking, not elsewhere classified (R26.2)     Time: 0263-7858 PT Time Calculation (min) (ACUTE ONLY): 25 min  Charges:  $Gait Training: 8-22 mins $Therapeutic Activity: 8-22 mins                    Rica Koyanagi  PTA Acute  Rehabilitation Services Pager      [phone] Office      [phone]

## 2020-12-01 NOTE — Plan of Care (Signed)
Reviewed written d/c instructions w pt and all questions answered. Pt verbalized understanding. D/C per w/c w all belongings in stable condition. 

## 2020-12-01 NOTE — Progress Notes (Signed)
Physical Therapy Treatment Patient Details Name: Kathryn [name] MRN: [id number] DOB: 06/03/1940 Today's Date: 12/01/2020    History of Present Illness Patient is 80 y.o. female s/p Rt TKA on 11/30/20 with PMH significant for OA, HTN, Hypothyroidism, Rt THA in 2017.    PT Comments    POD # 1 am session General bed mobility comments: demonstarted and instructed how to use belt loop to self assist LE off bed.  General transfer comment: 50% VC's to avoid pulling self up on walker and safety with turns. General Gait Details: 50% VC's on proper walker to self distance, proper sequencing as well as safety with turns plus "roll" not "pick up" walker.  Pt tolerated a functional distance. Then returned to room to perform some TE's following HEP handout.  Instructed on proper tech, freq as well as use of ICE.   Pt will need another PT session to practice stairs and increase her gait knowledge.   Follow Up Recommendations  Follow surgeon's recommendation for DC plan and follow-up therapies;Outpatient PT     Equipment Recommendations  None recommended by PT    Recommendations for Other Services       Precautions / Restrictions Precautions Precautions: Fall Precaution Comments: instructed to wear KI for D/C to home/+stairs then remove Required Braces or Orthoses: Knee Immobilizer - Right Knee Immobilizer - Right: On when out of bed or walking Restrictions Weight Bearing Restrictions: No RLE Weight Bearing: Weight bearing as tolerated    Mobility  Bed Mobility Overal bed mobility: Needs Assistance Bed Mobility: Supine to Sit     Supine to sit: Supervision;Min guard     General bed mobility comments: demonstarted and instructed how to use belt loop to self assist LE off bed    Transfers Overall transfer level: Needs assistance Equipment used: Rolling walker (2 wheeled) Transfers: Sit to/from [employer] Sit to Stand: Min guard Stand pivot transfers: Min guard;Min  assist       General transfer comment: 50% VC's to avoid pulling self up on walker and safety with turns.  Ambulation/Gait Ambulation/Gait assistance: Min guard Gait Distance (Feet): 45 Feet Assistive device: Rolling walker (2 wheeled) Gait Pattern/deviations: Step-to pattern;Decreased stride length;Decreased weight shift to right;Antalgic Gait velocity: decr   General Gait Details: 50% VC's on proper walker to self distance, proper sequencing as well as safety with turns plus "roll" not "pick up" walker.  Pt tolerated a functional distance.   Stairs             Wheelchair Mobility    Modified Rankin (Stroke Patients Only)       Balance                                            Cognition Arousal/Alertness: Awake/alert Behavior During Therapy: WFL for tasks assessed/performed Overall Cognitive Status: Within Functional Limits for tasks assessed                                 General Comments: AxO x 3 very pleasant      Exercises  Total Knee Replacement TE's following HEP handout 10 reps B LE ankle pumps 05 reps towel squeezes 05 reps knee presses 05 reps heel slides  05 reps SAQ's 05 reps SLR's 05 reps ABD Educated on use of gait belt to assist  with TE's Followed by ICE     General Comments        Pertinent Vitals/Pain Pain Assessment: 0-10 Pain Score: 4  Pain Location: Rt knee Pain Descriptors / Indicators: Operative site guarding;Discomfort;Tightness Pain Intervention(s): Monitored during session;Repositioned;Ice applied;Premedicated before session    Home Living                      Prior Function            PT Goals (current goals can now be found in the care plan section) Progress towards PT goals: Progressing toward goals    Frequency    7X/week      PT Plan Current plan remains appropriate    Co-evaluation              AM-PAC PT "6 Clicks" Mobility   Outcome Measure  Help  needed turning from your back to your side while in a flat bed without using bedrails?: A Little Help needed moving from lying on your back to sitting on the side of a flat bed without using bedrails?: A Little Help needed moving to and from a bed to a chair (including a wheelchair)?: A Little Help needed standing up from a chair using your arms (e.g., wheelchair or bedside chair)?: A Little Help needed to walk in hospital room?: A Little Help needed climbing 3-5 steps with a railing? : A Lot 6 Click Score: 17    End of Session Equipment Utilized During Treatment: Gait belt;Right knee immobilizer Activity Tolerance: Patient tolerated treatment well Patient left: in chair;with call bell/phone within reach;with chair alarm set Nurse Communication: Mobility status PT Visit Diagnosis: Muscle weakness (generalized) (M62.81);Difficulty in walking, not elsewhere classified (R26.2)     Time: 7026-3785 PT Time Calculation (min) (ACUTE ONLY): 40 min  Charges:  $Gait Training: 8-22 mins $Therapeutic Exercise: 8-22 mins $Therapeutic Activity: 8-22 mins                     {Charleston Hankin  PTA Acute  Rehabilitation Services Pager      [phone] Office      [phone]

## 2020-12-03 ENCOUNTER — Emergency Department (HOSPITAL_BASED_OUTPATIENT_CLINIC_OR_DEPARTMENT_OTHER): Payer: Medicare Other

## 2020-12-03 ENCOUNTER — Other Ambulatory Visit: Payer: Self-pay

## 2020-12-03 ENCOUNTER — Encounter (HOSPITAL_COMMUNITY): Payer: Self-pay | Admitting: Orthopedic Surgery

## 2020-12-03 ENCOUNTER — Inpatient Hospital Stay (HOSPITAL_BASED_OUTPATIENT_CLINIC_OR_DEPARTMENT_OTHER)
Admission: EM | Admit: 2020-12-03 | Discharge: 2020-12-06 | DRG: 389 | Disposition: A | Discharge status: Home / Self Care | Payer: Medicare Other | Attending: Internal Medicine | Admitting: Internal Medicine

## 2020-12-03 DIAGNOSIS — E876 Hypokalemia: Secondary | ICD-10-CM | POA: Diagnosis not present

## 2020-12-03 DIAGNOSIS — E039 Hypothyroidism, unspecified: Secondary | ICD-10-CM | POA: Diagnosis present

## 2020-12-03 DIAGNOSIS — I10 Essential (primary) hypertension: Secondary | ICD-10-CM | POA: Diagnosis present

## 2020-12-03 DIAGNOSIS — I4891 Unspecified atrial fibrillation: Secondary | ICD-10-CM | POA: Diagnosis not present

## 2020-12-03 DIAGNOSIS — K567 Ileus, unspecified: Principal | ICD-10-CM | POA: Diagnosis present

## 2020-12-03 DIAGNOSIS — Z96641 Presence of right artificial hip joint: Secondary | ICD-10-CM | POA: Diagnosis not present

## 2020-12-03 DIAGNOSIS — Z96651 Presence of right artificial knee joint: Secondary | ICD-10-CM | POA: Diagnosis not present

## 2020-12-03 DIAGNOSIS — E119 Type 2 diabetes mellitus without complications: Secondary | ICD-10-CM | POA: Diagnosis present

## 2020-12-03 DIAGNOSIS — R109 Unspecified abdominal pain: Secondary | ICD-10-CM | POA: Diagnosis present

## 2020-12-03 DIAGNOSIS — R339 Retention of urine, unspecified: Secondary | ICD-10-CM | POA: Diagnosis present

## 2020-12-03 DIAGNOSIS — Z20822 Contact with and (suspected) exposure to covid-19: Secondary | ICD-10-CM | POA: Diagnosis present

## 2020-12-03 DIAGNOSIS — Z79890 Hormone replacement therapy (postmenopausal): Secondary | ICD-10-CM | POA: Diagnosis not present

## 2020-12-03 DIAGNOSIS — E890 Postprocedural hypothyroidism: Secondary | ICD-10-CM | POA: Diagnosis present

## 2020-12-03 DIAGNOSIS — Z9851 Tubal ligation status: Secondary | ICD-10-CM

## 2020-12-03 DIAGNOSIS — Z7901 Long term (current) use of anticoagulants: Secondary | ICD-10-CM | POA: Diagnosis not present

## 2020-12-03 DIAGNOSIS — Z79899 Other long term (current) drug therapy: Secondary | ICD-10-CM

## 2020-12-03 DIAGNOSIS — E871 Hypo-osmolality and hyponatremia: Secondary | ICD-10-CM | POA: Diagnosis not present

## 2020-12-03 LAB — BASIC METABOLIC PANEL
Anion gap: 11 (ref 5–15)
BUN: 11 mg/dL (ref 8–23)
CO2: 24 mmol/L (ref 22–32)
Calcium: 7.1 mg/dL — ABNORMAL LOW (ref 8.9–10.3)
Chloride: 90 mmol/L — ABNORMAL LOW (ref 98–111)
Creatinine, Ser: 0.47 mg/dL (ref 0.44–1.00)
GFR, Estimated: >60 mL/min (ref >60)
Glucose, Bld: 156 mg/dL — ABNORMAL HIGH (ref 70–99)
Potassium: 3 mmol/L — ABNORMAL LOW (ref 3.5–5.1)
Sodium: 125 mmol/L — ABNORMAL LOW (ref 135–145)

## 2020-12-03 LAB — COMPREHENSIVE METABOLIC PANEL
ALT: 22 U/L (ref 0–44)
AST: 24 U/L (ref 15–41)
Albumin: 3.8 g/dL (ref 3.5–5.0)
Alkaline Phosphatase: 96 U/L (ref 38–126)
Anion gap: 14 (ref 5–15)
BUN: 12 mg/dL (ref 8–23)
CO2: 26 mmol/L (ref 22–32)
Calcium: 8.2 mg/dL — ABNORMAL LOW (ref 8.9–10.3)
Chloride: 85 mmol/L — ABNORMAL LOW (ref 98–111)
Creatinine, Ser: 0.54 mg/dL (ref 0.44–1.00)
GFR, Estimated: >60 mL/min (ref >60)
Glucose, Bld: 168 mg/dL — ABNORMAL HIGH (ref 70–99)
Potassium: 3.5 mmol/L (ref 3.5–5.1)
Sodium: 125 mmol/L — ABNORMAL LOW (ref 135–145)
Total Bilirubin: 0.8 mg/dL (ref 0.3–1.2)
Total Protein: 7.2 g/dL (ref 6.5–8.1)

## 2020-12-03 LAB — URINALYSIS, ROUTINE W REFLEX MICROSCOPIC
Bilirubin Urine: NEGATIVE
Glucose, UA: NEGATIVE mg/dL
Ketones, ur: NEGATIVE mg/dL
Nitrite: NEGATIVE
Protein, ur: NEGATIVE mg/dL
Specific Gravity, Urine: 1.01 (ref 1.005–1.030)
pH: 7.5 (ref 5.0–8.0)

## 2020-12-03 LAB — URINALYSIS, MICROSCOPIC (REFLEX)

## 2020-12-03 LAB — RESP PANEL BY RT-PCR (FLU A&B, COVID) ARPGX2
Influenza A by PCR: NEGATIVE
Influenza B by PCR: NEGATIVE
SARS Coronavirus 2 by RT PCR: NEGATIVE

## 2020-12-03 LAB — CBC
HCT: 35.1 % — ABNORMAL LOW (ref 36.0–46.0)
Hemoglobin: 12.8 g/dL (ref 12.0–15.0)
MCH: 34.7 pg — ABNORMAL HIGH (ref 26.0–34.0)
MCHC: 36.5 g/dL — ABNORMAL HIGH (ref 30.0–36.0)
MCV: 95.1 fL (ref 80.0–100.0)
Platelets: 301 10*3/uL (ref 150–400)
RBC: 3.69 MIL/uL — ABNORMAL LOW (ref 3.87–5.11)
RDW: 11.1 % — ABNORMAL LOW (ref 11.5–15.5)
WBC: 11.6 10*3/uL — ABNORMAL HIGH (ref 4.0–10.5)
nRBC: 0 % (ref 0.0–0.2)

## 2020-12-03 LAB — LIPASE, BLOOD: Lipase: 37 U/L (ref 11–51)

## 2020-12-03 MED ORDER — SODIUM CHLORIDE 0.9 % IV SOLN: Freq: Once | INTRAVENOUS | Status: AC

## 2020-12-03 MED ORDER — IOHEXOL 300 MG/ML  SOLN
75.0000 mL | Freq: Once | INTRAMUSCULAR | Status: AC | PRN
  Administered 2020-12-03: 75 mL via INTRAVENOUS

## 2020-12-03 MED ORDER — POTASSIUM CHLORIDE 10 MEQ/100ML IV SOLN
10.0000 meq | INTRAVENOUS | Status: AC
  Administered 2020-12-03 (×3): 10 meq via INTRAVENOUS
  Filled 2020-12-03 (×3): qty 100

## 2020-12-03 MED ORDER — ACETAMINOPHEN 325 MG PO TABS
650.0000 mg | ORAL_TABLET | Freq: Once | ORAL | Status: DC
  Filled 2020-12-03 (×3): qty 2

## 2020-12-03 MED ORDER — ALUM & MAG HYDROXIDE-SIMETH 200-200-20 MG/5ML PO SUSP
30.0000 mL | Freq: Once | ORAL | Status: AC
  Administered 2020-12-03: 30 mL via ORAL
  Filled 2020-12-03: qty 30

## 2020-12-03 MED ORDER — FENTANYL CITRATE (PF) 100 MCG/2ML IJ SOLN
50.0000 ug | Freq: Once | INTRAMUSCULAR | Status: AC
Start: 2020-12-03 — End: 2020-12-03
  Administered 2020-12-03: 50 ug via INTRAVENOUS
  Filled 2020-12-03: qty 2

## 2020-12-03 MED ORDER — PROCHLORPERAZINE EDISYLATE 10 MG/2ML IJ SOLN
10.0000 mg | Freq: Once | INTRAMUSCULAR | Status: AC
  Administered 2020-12-03: 10 mg via INTRAVENOUS
  Filled 2020-12-03: qty 2

## 2020-12-03 MED ORDER — HYDRALAZINE HCL 20 MG/ML IJ SOLN
10.0000 mg | Freq: Once | INTRAMUSCULAR | Status: AC
  Administered 2020-12-03: 10 mg via INTRAVENOUS
  Filled 2020-12-03: qty 1

## 2020-12-03 MED ORDER — DIPHENHYDRAMINE HCL 50 MG/ML IJ SOLN
25.0000 mg | Freq: Once | INTRAMUSCULAR | Status: AC
  Administered 2020-12-03: 25 mg via INTRAVENOUS
  Filled 2020-12-03: qty 1

## 2020-12-03 MED ORDER — ONDANSETRON HCL 4 MG/2ML IJ SOLN
4.0000 mg | Freq: Once | INTRAMUSCULAR | Status: AC
  Administered 2020-12-03: 4 mg via INTRAVENOUS
  Filled 2020-12-03: qty 2

## 2020-12-03 MED ORDER — SODIUM CHLORIDE 0.9 % IV BOLUS
1000.0000 mL | Freq: Once | INTRAVENOUS | Status: AC
  Administered 2020-12-03: 1000 mL via INTRAVENOUS

## 2020-12-03 MED ORDER — SODIUM CHLORIDE 0.9 % IV SOLN: INTRAVENOUS | Status: DC | PRN

## 2020-12-03 NOTE — ED Notes (Signed)
BLADDER scanner results :735cc MD at bedside and aware

## 2020-12-03 NOTE — ED Provider Notes (Addendum)
[facility] EMERGENCY DEPARTMENT Provider Note   CSN: [id number] Arrival date & time: 12/03/20  1134     History Chief Complaint  Patient presents with   Abdominal Pain    Kathryn [name] is a 80 y.o. female.  The history is provided by the patient.  Abdominal Pain Pain location:  Generalized Pain quality: aching and bloating   Pain radiates to:  Does not radiate Pain severity:  Moderate Onset quality:  Gradual Duration:  12 hours Timing:  Constant Progression:  Unchanged Chronicity:  New Context: previous surgery   Relieved by:  Nothing Worsened by:  Nothing Associated symptoms: nausea and vomiting   Associated symptoms: no chest pain, no chills, no cough, no dysuria, no fever, no hematuria, no shortness of breath and no sore throat   Risk factors: multiple surgeries   Risk factors comment:  Right knee surgery several days ago, N/V overnight     Past Medical History:  Diagnosis Date   Arthritis    Dysrhythmia    a fib   Family history of adverse reaction to anesthesia    mother has problems with N/V    Hypertension    Hypothyroidism    PONV (postoperative nausea and vomiting)    Pre-diabetes    HGBa1c high    Patient Active Problem List   Diagnosis Date Noted   OA (osteoarthritis) of knee 11/30/2020   Primary osteoarthritis of right knee 11/30/2020   OA (osteoarthritis) of hip 07/01/2015    Past Surgical History:  Procedure Laterality Date   bilaterl carpal tunnel release      COLON RESECTION     12 inches   COLON SURGERY     right knee meniscal tear surgery      THYROIDECTOMY     TOTAL HIP ARTHROPLASTY Right 07/01/2015   Procedure: RIGHT TOTAL HIP ARTHROPLASTY ANTERIOR APPROACH;  Surgeon: Ollen Gross, MD;  Location: [facility];  Service: Orthopedics;  Laterality: Right;   TOTAL KNEE ARTHROPLASTY Right 11/30/2020   Procedure: TOTAL KNEE ARTHROPLASTY;  Surgeon: Ollen Gross, MD;  Location: [facility];  Service: Orthopedics;  Laterality: Right;    TUBAL LIGATION       OB History   No obstetric history on file.     No family history on file.  Social History   Tobacco Use   Smoking status: Never   Smokeless tobacco: Never  Vaping Use   Vaping Use: Never used  Substance Use Topics   Alcohol use: Not Currently    Comment: occasional glass of wine    Drug use: No    Home Medications Prior to Admission medications   Medication Sig Start Date End Date Taking? Authorizing Provider  acetaminophen (TYLENOL) 500 MG tablet Take 500-1,000 mg by mouth every 6 (six) hours as needed (pain).    [provider]  diltiazem (CARDIZEM CD) 240 MG 24 hr capsule Take 240 mg by mouth in the morning. 09/07/20   [provider]  diphenhydrAMINE (BENADRYL) 25 MG tablet Take 25 mg by mouth at bedtime.    [provider]  docusate sodium (COLACE) 100 MG capsule Take 100 mg by mouth 2 (two) times daily.    [provider]  gabapentin (NEURONTIN) 300 MG capsule Take 300-600 mg by mouth See admin instructions. Take 1 capsule (300 mg) by mouth twice daily & take 2 capsules (600 mg) by mouth at bedtime 11/05/20   [provider]  hydrALAZINE (APRESOLINE) 50 MG tablet Take 50 mg by  mouth in the morning and at bedtime. Morning & with supper    [provider]  levothyroxine (SYNTHROID) 100 MCG tablet Take 100 mcg by mouth daily before breakfast. 08/06/20   [provider]  LORazepam (ATIVAN) 1 MG tablet Take 0.5 mg by mouth at bedtime. 09/22/20   [provider]  losartan (COZAAR) 100 MG tablet Take 100 mg by mouth in the morning. 09/14/20   [provider]  magnesium oxide (MAG-OX) 400 MG tablet Take 400 mg by mouth every evening.    [provider]  methocarbamol (ROBAXIN) 500 MG tablet Take 1 tablet (500 mg total) by mouth every 6 (six) hours as needed for muscle spasms. 12/01/20   Edmisten, Lyn Hollingshead, PA  Methylcellulose, Laxative, (FIBER THERAPY) 500 MG TABS Take 500 mg by  mouth in the morning and at bedtime.    [provider]  omeprazole (PRILOSEC) 20 MG capsule Take 20 mg by mouth daily as needed (indigestion/heartburn).    [provider]  oxyCODONE (OXY IR/ROXICODONE) 5 MG immediate release tablet Take 1-2 tablets (5-10 mg total) by mouth every 6 (six) hours as needed for moderate pain or severe pain. Not to exceed 6 tablets a day. 12/01/20   Edmisten, Kristie L, PA  psyllium (METAMUCIL) 58.6 % packet Take 1 packet by mouth in the morning and at bedtime. Before breakfast & at bedtime    [provider]  rivaroxaban (XARELTO) 10 MG TABS tablet Take 1 tablet (10 mg total) by mouth daily for 20 days. Then resume one 81 mg aspirin once a day. 12/01/20 12/21/20  Edmisten, Lyn Hollingshead, PA  sotalol (BETAPACE) 80 MG tablet Take 80 mg by mouth 2 (two) times daily. 10/27/20   [provider]  spironolactone (ALDACTONE) 25 MG tablet Take 12.5 mg by mouth in the morning. 10/03/20   [provider]    Allergies    Patient has no known allergies.  Review of Systems   Review of Systems  Constitutional:  Negative for chills and fever.  HENT:  Negative for ear pain and sore throat.   Eyes:  Negative for pain and visual disturbance.  Respiratory:  Negative for cough and shortness of breath.   Cardiovascular:  Negative for chest pain and palpitations.  Gastrointestinal:  Positive for abdominal pain, nausea and vomiting.  Genitourinary:  Negative for dysuria and hematuria.  Musculoskeletal:  Negative for arthralgias and back pain.  Skin:  Negative for color change and rash.  Neurological:  Negative for seizures and syncope.  All other systems reviewed and are negative.  Physical Exam Updated Vital Signs BP (!) 174/89   Pulse 77   Temp 97.8 F (36.6 C) (Oral)   Resp 18   SpO2 98%   Physical Exam Vitals and nursing note reviewed.  Constitutional:      General: She is not in acute distress.    Appearance: She is well-developed.  She is not ill-appearing.  HENT:     Head: Normocephalic and atraumatic.     Mouth/Throat:     Mouth: Mucous membranes are moist.  Eyes:     Extraocular Movements: Extraocular movements intact.     Conjunctiva/sclera: Conjunctivae normal.     Pupils: Pupils are equal, round, and reactive to light.  Cardiovascular:     Rate and Rhythm: Normal rate and regular rhythm.     Pulses: Normal pulses.     Heart sounds: Normal heart sounds. No murmur heard. Pulmonary:     Effort: Pulmonary  effort is normal. No respiratory distress.     Breath sounds: Normal breath sounds.  Abdominal:     Palpations: Abdomen is soft.     Tenderness: There is generalized abdominal tenderness.  Musculoskeletal:     Cervical back: Neck supple.  Skin:    General: Skin is warm and dry.     Capillary Refill: Capillary refill takes less than 2 seconds.     Comments: Surgical site to the right knee is clean dry and intact  Neurological:     General: No focal deficit present.     Mental Status: She is alert.    ED Results / Procedures / Treatments   Labs (all labs ordered are listed, but only abnormal results are displayed) Labs Reviewed  COMPREHENSIVE METABOLIC PANEL - Abnormal; Notable for the following components:      Result Value   Sodium 125 (*)    Chloride 85 (*)    Glucose, Bld 168 (*)    Calcium 8.2 (*)    All other components within normal limits  CBC - Abnormal; Notable for the following components:   WBC 11.6 (*)    RBC 3.69 (*)    HCT 35.1 (*)    MCH 34.7 (*)    MCHC 36.5 (*)    RDW 11.1 (*)    All other components within normal limits  URINALYSIS, ROUTINE W REFLEX MICROSCOPIC - Abnormal; Notable for the following components:   APPearance HAZY (*)    Hgb urine dipstick TRACE (*)    Leukocytes,Ua SMALL (*)    All other components within normal limits  URINALYSIS, MICROSCOPIC (REFLEX) - Abnormal; Notable for the following components:   Bacteria, UA RARE (*)    All other components  within normal limits  BASIC METABOLIC PANEL - Abnormal; Notable for the following components:   Sodium 125 (*)    Potassium 3.0 (*)    Chloride 90 (*)    Glucose, Bld 156 (*)    Calcium 7.1 (*)    All other components within normal limits  URINE CULTURE  LIPASE, BLOOD    EKG None  Radiology CT ABDOMEN PELVIS W CONTRAST  Result Date: 12/03/2020 CLINICAL DATA:  Abdominal pain, nausea vomiting and constipation EXAM: CT ABDOMEN AND PELVIS WITH CONTRAST TECHNIQUE: Multidetector CT imaging of the abdomen and pelvis was performed using the standard protocol following bolus administration of intravenous contrast. CONTRAST:  48mL OMNIPAQUE IOHEXOL 300 MG/ML  SOLN COMPARISON:  None. FINDINGS: Lower chest: No acute abnormality. Hepatobiliary: Hypodense lesions in the left hepatic lobe measuring up to 1.8 x 1.5 cm, incompletely characterized but statistically likely to represent benign cysts or hemangiomas. Gallbladder is unremarkable. No biliary ductal dilation. Pancreas: Within normal limits. Spleen: Within normal limits. Adrenals/Urinary Tract: Adrenal glands are unremarkable. Kidneys are normal, without renal calculi, solid enhancing lesion, or hydronephrosis. There is gas in a distended urinary bladder, recommend correlation with recent history of instrumentation. Stomach/Bowel: Small hiatal hernia otherwise the stomach is unremarkable for degree of distension. Normal positioning of the duodenum/ligament of Treitz. Fecalized loops of prominent distal small bowel without pathologic dilation of small bowel. The appendix and terminal ileum appear normal. Colonic diverticulosis without findings of acute diverticulitis. Sigmoid colonic anastomotic sutures. Moderate volume of stool in the colon. Vascular/Lymphatic: Aorta bi-iliac atherosclerotic calcifications without abdominal aortic aneurysm. No pathologically enlarged abdominal or pelvic lymph nodes. Reproductive: Lobular uterine contour likely reflecting  uterine leiomyomas. No suspicious adnexal mass. Other: No abdominopelvic ascites. Musculoskeletal: Severe multilevel degenerative changes spine  with multifocal neural foraminal and canal narrowing in the lower lumbar spine. Levoconvex curvature of the lumbar spine. Right total hip arthroplasty. Degenerative change of the left hip. No acute osseous abnormality. IMPRESSION: 1. Moderate volume of formed stool throughout the colon with fecalized loops of prominent distal small bowel without pathologic dilation. Findings may reflect slow transit/constipation. 2. Colonic diverticulosis without findings of acute diverticulitis. 3. Severe multilevel degenerative changes spine with multifocal neural foraminal and spinal canal narrowing in the lower lumbar spine. 4. Gas in a distended urinary bladder, recommend correlation with recent history of instrumentation. 5. Hypodense lesions in the left hepatic lobe measuring up to 1.8 x 1.5 cm, incompletely characterized but statistically likely to represent benign cysts or hemangiomas in the absence of known malignancy. Consider more definitive characterization with nonemergent outpatient hepatic protocol MRI with and without contrast if clinically indicated. 6.  Aortic Atherosclerosis (ICD10-I70.0). Electronically Signed   By: Maudry MayhewJeffrey  Waltz MD   On: 12/03/2020 16:49    Procedures .Critical Care  Date/Time: 12/03/2020 7:44 PM Performed by: Virgina Norfolkuratolo, [place] Dost, DO Authorized by: Virgina Norfolkuratolo, Raneshia Derick, DO   Critical care provider statement:    Critical care time (minutes):  35   Critical care was necessary to treat or prevent imminent or life-threatening deterioration of the following conditions:  Dehydration (hyponatremia)   Critical care was time spent personally by me on the following activities:  Blood draw for specimens, development of treatment plan with patient or surrogate, discussions with primary provider, evaluation of patient's response to treatment, examination of  patient, obtaining history from patient or surrogate, ordering and performing treatments and interventions, ordering and review of laboratory studies, ordering and review of radiographic studies, pulse oximetry, review of old charts and re-evaluation of patient's condition   Care discussed with: admitting provider     Medications Ordered in ED Medications  acetaminophen (TYLENOL) tablet 650 mg (0 mg Oral Hold 12/03/20 1824)  prochlorperazine (COMPAZINE) injection 10 mg (has no administration in time range)  diphenhydrAMINE (BENADRYL) injection 25 mg (has no administration in time range)  0.9 %  sodium chloride infusion (has no administration in time range)  ondansetron (ZOFRAN) injection 4 mg (4 mg Intravenous Given 12/03/20 1528)  iohexol (OMNIPAQUE) 300 MG/ML solution 75 mL (75 mLs Intravenous Contrast Given 12/03/20 1557)  sodium chloride 0.9 % bolus 1,000 mL (0 mLs Intravenous Stopped 12/03/20 1826)  alum & mag hydroxide-simeth (MAALOX/MYLANTA) 200-200-20 MG/5ML suspension 30 mL (30 mLs Oral Given 12/03/20 1823)    ED Course  I have reviewed the triage vital signs and the nursing notes.  Pertinent labs & imaging results that were available during my care of the patient were reviewed by me and considered in my medical decision making (see chart for details).    MDM Rules/Calculators/A&P                           Kathryn [name] is here with abdominal pain.  History abdominal surgery.  Recent right knee surgery.  Appears that she had spinal anesthesia for the surgery.  She is developed abdominal pain overnight with nausea and vomiting.  Has been constipated.  She is tender diffusely on exam.  Vital signs overall unremarkable except for some hypertension.  CT scan was overall unremarkable except for constipation and what appeared to be a distended bladder.  Bladder scan showed 750 cc of urine.  Foley catheter was placed.  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  Sodium is 125 which is significantly down from prior sodiums.  She has had a lot of nausea and vomiting and still fairly uncomfortable and symptomatic at this point.  We will talk about possible admission after she gets Foley catheter and see how she feels.  But suspect that she may need admission to monitor sodium and her symptoms as she is not able to tolerate p.o. at this time.  Still fairly symptomatic and sodium still at 125.  Potassium now at 3.  Overall believe she has an ileus and urinary retention secondary to recent anesthesia.  Still fairly symptomatic despite Foley placement.  We will give her another round antiemetics and start her maintenance fluids.  We will give her some potassium as well.  We will admit to medicine for further care.  This chart was dictated using voice recognition software.  Despite best efforts to proofread,  errors can occur which can change the documentation meaning.   Final Clinical Impression(s) / ED Diagnoses Final diagnoses:  Ileus [facility])  Urinary retention  Hyponatremia    Rx / DC Orders ED Discharge Orders     None        Virgina Norfolk, DO 12/03/20 1943    Virgina Norfolk, DO 12/03/20 1943    Virgina Norfolk, DO 12/03/20 1944

## 2020-12-03 NOTE — ED Triage Notes (Signed)
Per pt and daughter pt with abd pain, n/v, constipation since knee surgery 8/8-to triage in w/c-NAD

## 2020-12-04 ENCOUNTER — Encounter (HOSPITAL_COMMUNITY): Payer: Self-pay | Admitting: Internal Medicine

## 2020-12-04 DIAGNOSIS — R1084 Generalized abdominal pain: Secondary | ICD-10-CM | POA: Diagnosis not present

## 2020-12-04 DIAGNOSIS — E039 Hypothyroidism, unspecified: Secondary | ICD-10-CM | POA: Diagnosis present

## 2020-12-04 DIAGNOSIS — R109 Unspecified abdominal pain: Secondary | ICD-10-CM | POA: Diagnosis present

## 2020-12-04 DIAGNOSIS — E871 Hypo-osmolality and hyponatremia: Secondary | ICD-10-CM

## 2020-12-04 DIAGNOSIS — I4891 Unspecified atrial fibrillation: Secondary | ICD-10-CM | POA: Diagnosis present

## 2020-12-04 DIAGNOSIS — I10 Essential (primary) hypertension: Secondary | ICD-10-CM | POA: Diagnosis present

## 2020-12-04 LAB — BASIC METABOLIC PANEL
Anion gap: 11 (ref 5–15)
Anion gap: 10 (ref 5–15)
BUN: 12 mg/dL (ref 8–23)
BUN: 15 mg/dL (ref 8–23)
CO2: 25 mmol/L (ref 22–32)
CO2: 25 mmol/L (ref 22–32)
Calcium: 7.6 mg/dL — ABNORMAL LOW (ref 8.9–10.3)
Calcium: 7.4 mg/dL — ABNORMAL LOW (ref 8.9–10.3)
Chloride: 92 mmol/L — ABNORMAL LOW (ref 98–111)
Chloride: 92 mmol/L — ABNORMAL LOW (ref 98–111)
Creatinine, Ser: 0.63 mg/dL (ref 0.44–1.00)
Creatinine, Ser: 0.69 mg/dL (ref 0.44–1.00)
GFR, Estimated: >60 mL/min (ref >60)
GFR, Estimated: >60 mL/min (ref >60)
Glucose, Bld: 144 mg/dL — ABNORMAL HIGH (ref 70–99)
Glucose, Bld: 238 mg/dL — ABNORMAL HIGH (ref 70–99)
Potassium: 3.5 mmol/L (ref 3.5–5.1)
Potassium: 3.5 mmol/L (ref 3.5–5.1)
Sodium: 128 mmol/L — ABNORMAL LOW (ref 135–145)
Sodium: 127 mmol/L — ABNORMAL LOW (ref 135–145)

## 2020-12-04 LAB — CBC WITH DIFFERENTIAL/PLATELET
Abs Immature Granulocytes: 0.04 10*3/uL (ref 0.00–0.07)
Basophils Absolute: 0 10*3/uL (ref 0.0–0.1)
Basophils Relative: 0 %
Eosinophils Absolute: 0.1 10*3/uL (ref 0.0–0.5)
Eosinophils Relative: 1 %
HCT: 36.3 % (ref 36.0–46.0)
Hemoglobin: 12.6 g/dL (ref 12.0–15.0)
Immature Granulocytes: 0 %
Lymphocytes Relative: 15 %
Lymphs Abs: 1.5 10*3/uL (ref 0.7–4.0)
MCH: 33.9 pg (ref 26.0–34.0)
MCHC: 34.7 g/dL (ref 30.0–36.0)
MCV: 97.6 fL (ref 80.0–100.0)
Monocytes Absolute: 0.9 10*3/uL (ref 0.1–1.0)
Monocytes Relative: 10 %
Neutro Abs: 7.2 10*3/uL (ref 1.7–7.7)
Neutrophils Relative %: 74 %
Platelets: 307 10*3/uL (ref 150–400)
RBC: 3.72 MIL/uL — ABNORMAL LOW (ref 3.87–5.11)
RDW: 11.3 % — ABNORMAL LOW (ref 11.5–15.5)
WBC: 9.7 10*3/uL (ref 4.0–10.5)
nRBC: 0 % (ref 0.0–0.2)

## 2020-12-04 LAB — GLUCOSE, CAPILLARY
Glucose-Capillary: 171 mg/dL — ABNORMAL HIGH (ref 70–99)
Glucose-Capillary: 179 mg/dL — ABNORMAL HIGH (ref 70–99)
Glucose-Capillary: 149 mg/dL — ABNORMAL HIGH (ref 70–99)
Glucose-Capillary: 160 mg/dL — ABNORMAL HIGH (ref 70–99)

## 2020-12-04 LAB — TSH: TSH: 0.941 u[IU]/mL (ref 0.350–4.500)

## 2020-12-04 MED ORDER — SODIUM CHLORIDE 0.9 % IV SOLN: INTRAVENOUS | Status: DC | PRN

## 2020-12-04 MED ORDER — RIVAROXABAN 10 MG PO TABS
10.0000 mg | ORAL_TABLET | Freq: Every day | ORAL | Status: DC
  Administered 2020-12-04 – 2020-12-06 (×3): 10 mg via ORAL
  Filled 2020-12-04 (×3): qty 1

## 2020-12-04 MED ORDER — GABAPENTIN 300 MG PO CAPS
600.0000 mg | ORAL_CAPSULE | Freq: Every day | ORAL | Status: DC
  Administered 2020-12-04 – 2020-12-05 (×2): 600 mg via ORAL
  Filled 2020-12-04 (×2): qty 2

## 2020-12-04 MED ORDER — ACETAMINOPHEN 325 MG PO TABS
650.0000 mg | ORAL_TABLET | Freq: Four times a day (QID) | ORAL | Status: DC | PRN
  Administered 2020-12-04 – 2020-12-06 (×6): 650 mg via ORAL
  Filled 2020-12-04 (×6): qty 2

## 2020-12-04 MED ORDER — LABETALOL HCL 5 MG/ML IV SOLN: 10.0000 mg | INTRAVENOUS | Status: DC | PRN

## 2020-12-04 MED ORDER — BISACODYL 10 MG RE SUPP
10.0000 mg | Freq: Once | RECTAL | Status: AC
  Administered 2020-12-04: 10 mg via RECTAL
  Filled 2020-12-04: qty 1

## 2020-12-04 MED ORDER — GABAPENTIN 300 MG PO CAPS
300.0000 mg | ORAL_CAPSULE | ORAL | Status: DC
  Administered 2020-12-04 – 2020-12-06 (×5): 300 mg via ORAL
  Filled 2020-12-04 (×5): qty 1

## 2020-12-04 MED ORDER — METHOCARBAMOL 500 MG PO TABS
500.0000 mg | ORAL_TABLET | Freq: Four times a day (QID) | ORAL | Status: DC | PRN
  Administered 2020-12-04 – 2020-12-06 (×5): 500 mg via ORAL
  Filled 2020-12-04 (×5): qty 1

## 2020-12-04 MED ORDER — LORAZEPAM 0.5 MG PO TABS
0.5000 mg | ORAL_TABLET | Freq: Every day | ORAL | Status: DC
  Administered 2020-12-04 – 2020-12-05 (×2): 0.5 mg via ORAL
  Filled 2020-12-04 (×2): qty 1

## 2020-12-04 MED ORDER — ACETAMINOPHEN 650 MG RE SUPP: 650.0000 mg | Freq: Four times a day (QID) | RECTAL | Status: DC | PRN

## 2020-12-04 MED ORDER — PANTOPRAZOLE SODIUM 40 MG PO TBEC
40.0000 mg | DELAYED_RELEASE_TABLET | Freq: Every day | ORAL | Status: DC
  Administered 2020-12-04 – 2020-12-06 (×3): 40 mg via ORAL
  Filled 2020-12-04 (×3): qty 1

## 2020-12-04 MED ORDER — INSULIN ASPART 100 UNIT/ML IJ SOLN
0.0000 [IU] | Freq: Three times a day (TID) | INTRAMUSCULAR | Status: DC
  Administered 2020-12-04 (×2): 1 [IU] via SUBCUTANEOUS

## 2020-12-04 MED ORDER — LOSARTAN POTASSIUM 50 MG PO TABS
100.0000 mg | ORAL_TABLET | Freq: Every day | ORAL | Status: DC
  Administered 2020-12-04: 100 mg via ORAL
  Filled 2020-12-04: qty 2

## 2020-12-04 MED ORDER — SOTALOL HCL 80 MG PO TABS
80.0000 mg | ORAL_TABLET | Freq: Two times a day (BID) | ORAL | Status: DC
  Administered 2020-12-04 – 2020-12-06 (×5): 80 mg via ORAL
  Filled 2020-12-04 (×6): qty 1

## 2020-12-04 MED ORDER — DOCUSATE SODIUM 100 MG PO CAPS
100.0000 mg | ORAL_CAPSULE | Freq: Two times a day (BID) | ORAL | Status: DC
  Administered 2020-12-04 – 2020-12-06 (×5): 100 mg via ORAL
  Filled 2020-12-04 (×5): qty 1

## 2020-12-04 MED ORDER — KETOROLAC TROMETHAMINE 15 MG/ML IJ SOLN
15.0000 mg | Freq: Once | INTRAMUSCULAR | Status: AC
  Administered 2020-12-04: 15 mg via INTRAVENOUS
  Filled 2020-12-04: qty 1

## 2020-12-04 MED ORDER — HYDRALAZINE HCL 50 MG PO TABS
50.0000 mg | ORAL_TABLET | Freq: Three times a day (TID) | ORAL | Status: DC
  Administered 2020-12-04 – 2020-12-06 (×6): 50 mg via ORAL
  Filled 2020-12-04 (×6): qty 1

## 2020-12-04 MED ORDER — LEVOTHYROXINE SODIUM 100 MCG PO TABS
100.0000 ug | ORAL_TABLET | Freq: Every day | ORAL | Status: DC
  Administered 2020-12-04 – 2020-12-06 (×3): 100 ug via ORAL
  Filled 2020-12-04 (×3): qty 1

## 2020-12-04 MED ORDER — DILTIAZEM HCL ER COATED BEADS 240 MG PO CP24
240.0000 mg | ORAL_CAPSULE | Freq: Every day | ORAL | Status: DC
  Administered 2020-12-04 – 2020-12-06 (×3): 240 mg via ORAL
  Filled 2020-12-04 (×3): qty 1

## 2020-12-04 NOTE — Progress Notes (Signed)
  PROGRESS NOTE  Patient admitted earlier this morning. See H&P.   Kathryn [name] is a 80 y.o. 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  Has not moved her bowels for last 4 days.  CT abdomen pelvis shows features concerning for slow transit/constipation.  Patient had urinary retention for which patient was placed on Foley catheter.  Patient had a small bowel movement this morning.  About to eat full liquid diet for breakfast.  No abdominal pain today.  Abdomen is soft and nontender to palpation on examination.  Ileus improving PT to evaluate today Added Colace Hyponatremia is improving with IV fluids Sent a staff message to let Dr. Lequita Halt know of patient's admission to the hospital Remove Foley for voiding trial today Advance diet as tolerated   Hopeful dc home 8/13.  Status is: Observation  The patient will require care spanning > 2 midnights and should be moved to inpatient because: Inpatient level of care appropriate due to severity of illness  Dispo: The patient is from: Home              Anticipated d/c is to: Home              Patient currently is not medically stable to d/c.   Difficult to place patient No       Noralee Stain, DO [facility] 12/04/2020, 11:36 AM  Available via Epic secure chat 7am-7pm After these hours, please refer to coverage provider listed on [email or web address]

## 2020-12-04 NOTE — Progress Notes (Signed)
Patient is requesting IV Toradol for pain. Also need cardiac monitoring order d/c'd. Paged Linton Flemings.

## 2020-12-04 NOTE — Plan of Care (Signed)
  Problem: Activity: Goal: Risk for activity intolerance will decrease Outcome: Progressing   Problem: Pain Managment: Goal: General experience of comfort will improve Outcome: Progressing   Problem: Safety: Goal: Ability to remain free from injury will improve Outcome: Progressing   

## 2020-12-04 NOTE — Evaluation (Signed)
Physical Therapy Evaluation Patient Details Name: Kathryn [name] MRN: [id number] DOB: April 24, 1941 Today's Date: 12/04/2020   History of Present Illness  Patient is 80 y.o. female s/p Rt TKA on 11/30/20 with PMH significant for OA, HTN, Hypothyroidism, Rt THA in 2017. Pt discharged on 8/9 after surgery and returned on 8/11 due to ~2 days of persistent nausea vomiting and abdominal discomfort.  In ED, CT of abdomen/pelvis shows features concerning for slow transit/constipation. Patient also had urinary retention for which patient was placed on Foley catheter.    Clinical Impression  Kathryn [name] is a 80 y.o. female s/p Rt TKA on 11/30/20 returning to [facility] on 8/11 due to n/v and abdominal pain and admitted for possible ileus. Patient reports progressing well with mobility since discharge and still using RW. Patient is now limited by functional impairments (see PT problem list below) and requires Min guard for transfers and gait with RW. Patient was able to ambulate ~70 feet with RW and min guard for safety. Gait distance limited due to loose stools and pt had small BM after gait. Patient instructed in exercise to facilitate ROM and circulation. Patient will benefit from continued skilled PT interventions to address impairments and progress towards PLOF. Acute PT will follow to progress mobility and stair training in preparation for safe discharge home.     Follow Up Recommendations Outpatient PT    Equipment Recommendations  None recommended by PT    Recommendations for Other Services       Precautions / Restrictions Precautions Precautions: Fall Restrictions Weight Bearing Restrictions: No RLE Weight Bearing: Weight bearing as tolerated Other Position/Activity Restrictions: WBAT      Mobility  Bed Mobility Overal bed mobility: Needs Assistance Bed Mobility: Supine to Sit     Supine to sit: HOB elevated;Min guard     General bed mobility comments: ceus to use bed rail, pt bring LE's off  EOB without assist.    Transfers Overall transfer level: Needs assistance Equipment used: Rolling walker (2 wheeled) Transfers: Sit to/from Stand Sit to Stand: Supervision         General transfer comment: pt using bil UE on walker to rise, no assist needed, pt steady.  Ambulation/Gait Ambulation/Gait assistance: Min guard Gait Distance (Feet): 70 Feet Assistive device: Rolling walker (2 wheeled) Gait Pattern/deviations: Step-to pattern;Decreased stride length;Decreased weight shift to right;Antalgic Gait velocity: decr   General Gait Details: pt maintained safe proximity to RW throughout, cues for posture as pt tended to look down. no overt LOB noted.  Stairs            Wheelchair Mobility    Modified Rankin (Stroke Patients Only)       Balance Overall balance assessment: Needs assistance Sitting-balance support: Feet supported Sitting balance-Leahy Scale: Good     Standing balance support: During functional activity;Bilateral upper extremity supported Standing balance-Leahy Scale: Poor                               Pertinent Vitals/Pain Pain Assessment: 0-10 Pain Location: Rt knee Pain Descriptors / Indicators: Operative site guarding;Discomfort;Tightness Pain Intervention(s): Limited activity within patient's tolerance;Repositioned;Ice applied    Home Living Family/patient expects to be discharged to:: Private residence Living Arrangements: Alone Available Help at Discharge: Family Type of Home: House Home Access: Stairs to enter Entrance Stairs-Rails: Right Entrance Stairs-Number of Steps: 1+1 Home Layout: One level Home Equipment: Environmental consultant - 2 wheels;Cane - single point;Bedside commode;Shower seat  Additional Comments: daughter staying for ~2 weeks amd son helping as well.    Prior Function Level of Independence: Independent with assistive device(s)               Hand Dominance   Dominant Hand: Right    Extremity/Trunk  Assessment   Upper Extremity Assessment Upper Extremity Assessment: Overall WFL for tasks assessed    Lower Extremity Assessment Lower Extremity Assessment: Overall WFL for tasks assessed;RLE deficits/detail RLE Deficits / Details: good strength, AROM appears ~4-80 degrees RLE Sensation: WNL RLE Coordination: WNL    Cervical / Trunk Assessment Cervical / Trunk Assessment: Normal  Communication   Communication: HOH  Cognition Arousal/Alertness: Awake/alert Behavior During Therapy: WFL for tasks assessed/performed Overall Cognitive Status: Within Functional Limits for tasks assessed                                        General Comments      Exercises Total Joint Exercises Ankle Circles/Pumps: AROM;Both;20 reps;Seated Quad Sets: AROM;Right;10 reps;Seated Short Arc Quad: AROM;Right;10 reps;Seated Heel Slides: AROM;Right;10 reps;Seated Hip ABduction/ADduction: AROM;Right;10 reps;Seated   Assessment/Plan    PT Assessment Patient needs continued PT services  PT Problem List Decreased strength;Decreased range of motion;Decreased activity tolerance;Decreased balance;Decreased mobility;Decreased knowledge of use of DME;Decreased knowledge of precautions;Pain       PT Treatment Interventions DME instruction;Gait training;Stair training;Functional mobility training;Therapeutic activities;Therapeutic exercise;Balance training;Patient/family education    PT Goals (Current goals can be found in the Care Plan section)  Acute Rehab PT Goals Patient Stated Goal: get back independence and get rid of bakers cyst PT Goal Formulation: With patient Time For Goal Achievement: 12/07/20 Potential to Achieve Goals: Good    Frequency 7X/week   Barriers to discharge        Co-evaluation               AM-PAC PT "6 Clicks" Mobility  Outcome Measure Help needed turning from your back to your side while in a flat bed without using bedrails?: None Help needed moving  from lying on your back to sitting on the side of a flat bed without using bedrails?: A Little Help needed moving to and from a bed to a chair (including a wheelchair)?: A Little Help needed standing up from a chair using your arms (e.g., wheelchair or bedside chair)?: A Little Help needed to walk in hospital room?: A Little Help needed climbing 3-5 steps with a railing? : A Little 6 Click Score: 19    End of Session Equipment Utilized During Treatment: Gait belt;Right knee immobilizer Activity Tolerance: Patient tolerated treatment well Patient left: in chair;with call bell/phone within reach;with chair alarm set;with family/visitor present Nurse Communication: Mobility status PT Visit Diagnosis: Muscle weakness (generalized) (M62.81);Difficulty in walking, not elsewhere classified (R26.2)    Time: 4332-9518 PT Time Calculation (min) (ACUTE ONLY): 38 min   Charges:   PT Evaluation $PT Eval Low Complexity: 1 Low PT Treatments $Gait Training: 8-22 mins $Therapeutic Exercise: 8-22 mins        Wynn Maudlin, DPT Acute Rehabilitation Services Office [phone] Pager [phone]   Kathryn [name] 12/04/2020, 11:58 AM

## 2020-12-04 NOTE — H&P (Addendum)
History and Physical    Kathryn [name] NWG:[id number] DOB: [birth year] DOA: 12/03/2020  PCP: Leola Brazil, DO  Patient coming from: Home.  Chief Complaint: Abdominal pain.  HPI: Kathryn [name] is a 80 y.o. 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  Has not moved her bowels for last 4 days.  Denies any chest pain shortness of breath.  Vomitus was clear liquid.  ED Course: In the ER patient had diffuse abdominal pain and CT abdomen pelvis shows features concerning for slow transit/constipation.  Patient had urinary retention for which patient was placed on Foley catheter.  Patient's labs show sodium of 125 and sodium about 2 days ago was 134.  COVID test was negative.  Patient's recent hemoglobin A1c did show was 6.6.  Patient admitted for further management of hyponatremia and possible ileus.  Review of Systems: As per HPI, rest all negative.   Past Medical History:  Diagnosis Date   Arthritis    Dysrhythmia    a fib   Family history of adverse reaction to anesthesia    mother has problems with N/V    Hypertension    Hypothyroidism    PONV (postoperative nausea and vomiting)    Pre-diabetes    HGBa1c high    Past Surgical History:  Procedure Laterality Date   bilaterl carpal tunnel release      COLON RESECTION     12 inches   COLON SURGERY     right knee meniscal tear surgery      THYROIDECTOMY     TOTAL HIP ARTHROPLASTY Right 07/01/2015   Procedure: RIGHT TOTAL HIP ARTHROPLASTY ANTERIOR APPROACH;  Surgeon: Ollen Gross, MD;  Location: [facility];  Service: Orthopedics;  Laterality: Right;   TOTAL KNEE ARTHROPLASTY Right 11/30/2020   Procedure: TOTAL KNEE ARTHROPLASTY;  Surgeon: Ollen Gross, MD;  Location: [facility];  Service: Orthopedics;  Laterality: Right;    TUBAL LIGATION       reports that she has never smoked. She has never used smokeless tobacco. She reports that she does not currently use alcohol. She reports that she does not use drugs.  No Known Allergies  History reviewed. No pertinent family history.  Prior to Admission medications   Medication Sig Start Date End Date Taking? Authorizing Provider  acetaminophen (TYLENOL) 500 MG tablet Take 500-1,000 mg by mouth every 6 (six) hours as needed (pain).    [provider]  diltiazem (CARDIZEM CD) 240 MG 24 hr capsule Take 240 mg by mouth in the morning. 09/07/20   [provider]  diphenhydrAMINE (BENADRYL) 25 MG tablet Take 25 mg by mouth at bedtime.    [provider]  docusate sodium (COLACE) 100 MG capsule Take 100 mg by mouth 2 (two) times daily.    [provider]  gabapentin (NEURONTIN) 300 MG capsule Take 300-600 mg by mouth See admin instructions. Take 1 capsule (300 mg) by mouth twice daily & take 2 capsules (600 mg) by mouth at bedtime 11/05/20   [provider]  hydrALAZINE (APRESOLINE) 50 MG tablet Take 50 mg by mouth in the morning and at bedtime. Morning & with supper    [provider]  levothyroxine (SYNTHROID) 100 MCG tablet Take 100 mcg by mouth daily before breakfast. 08/06/20   [provider]  LORazepam (ATIVAN) 1 MG tablet Take  0.5 mg by mouth at bedtime. 09/22/20   [provider]  losartan (COZAAR) 100 MG tablet Take 100 mg by mouth in the morning. 09/14/20   [provider]  magnesium oxide (MAG-OX) 400 MG tablet Take 400 mg by mouth every evening.    [provider]  methocarbamol (ROBAXIN) 500 MG tablet Take 1 tablet (500 mg total) by mouth every 6 (six) hours as needed for muscle spasms. 12/01/20   Edmisten, Lyn HollingsheadKristie L, PA  Methylcellulose, Laxative, (FIBER THERAPY) 500 MG TABS Take 500 mg by mouth in the morning and at bedtime.    [provider]  omeprazole (PRILOSEC) 20 MG  capsule Take 20 mg by mouth daily as needed (indigestion/heartburn).    [provider]  oxyCODONE (OXY IR/ROXICODONE) 5 MG immediate release tablet Take 1-2 tablets (5-10 mg total) by mouth every 6 (six) hours as needed for moderate pain or severe pain. Not to exceed 6 tablets a day. 12/01/20   Edmisten, Kristie L, PA  psyllium (METAMUCIL) 58.6 % packet Take 1 packet by mouth in the morning and at bedtime. Before breakfast & at bedtime    [provider]  rivaroxaban (XARELTO) 10 MG TABS tablet Take 1 tablet (10 mg total) by mouth daily for 20 days. Then resume one 81 mg aspirin once a day. 12/01/20 12/21/20  Edmisten, Lyn HollingsheadKristie L, PA  sotalol (BETAPACE) 80 MG tablet Take 80 mg by mouth 2 (two) times daily. 10/27/20   [provider]  spironolactone (ALDACTONE) 25 MG tablet Take 12.5 mg by mouth in the morning. 10/03/20   [provider]    Physical Exam: Constitutional: Moderately built and nourished. Vitals:   12/03/20 2155 12/03/20 2200 12/03/20 2230 12/04/20 0022  BP: (!) 170/84 (!) 153/66 137/76 (!) 159/83  Pulse: 69 64 84 90  Resp: 16 15 18 16   Temp:    98.4 F (36.9 C)  TempSrc:    Oral  SpO2: 97% 96% 97% 98%   Eyes: Anicteric no pallor. ENMT: No discharge from the ears eyes nose and mouth. Neck: No mass felt.  No neck rigidity. Respiratory: No rhonchi or crepitations. Cardiovascular: S1-S2 heard. Abdomen: Soft nontender bowel sounds are present.  No guarding or rigidity. Musculoskeletal: No edema.  Right lower extremity is in splint. Skin: No rash. Neurologic: Alert awake oriented to time place and person.  Moves all extremities. Psychiatric: Appears normal.  Normal affect.   Labs on Admission: I have personally reviewed following labs and imaging studies  CBC: Recent Labs  Lab 12/01/20 0326 12/03/20 1303  WBC 10.0 11.6*  HGB 11.9* 12.8  HCT 34.2* 35.1*  MCV 99.7 95.1  PLT 245 301   Basic Metabolic Panel: Recent Labs  Lab  12/01/20 0326 12/03/20 1303 12/03/20 1823  NA 134* 125* 125*  K 4.3 3.5 3.0*  CL 97* 85* 90*  CO2 27 26 24   GLUCOSE 153* 168* 156*  BUN 11 12 11   CREATININE 0.69 0.54 0.47  CALCIUM 7.9* 8.2* 7.1*   GFR: Estimated Creatinine Clearance: 41.6 mL/min (by C-G formula based on SCr of 0.47 mg/dL). Liver Function Tests: Recent Labs  Lab 12/03/20 1303  AST 24  ALT 22  ALKPHOS 96  BILITOT 0.8  PROT 7.2  ALBUMIN 3.8   Recent Labs  Lab 12/03/20 1303  LIPASE 37   No results for input(s): AMMONIA in the last 168 hours. Coagulation Profile: No results for input(s): INR, PROTIME in the last 168 hours. Cardiac Enzymes: No results for  input(s): CKTOTAL, CKMB, CKMBINDEX, TROPONINI in the last 168 hours. BNP (last 3 results) No results for input(s): PROBNP in the last 8760 hours. HbA1C: No results for input(s): HGBA1C in the last 72 hours. CBG: No results for input(s): GLUCAP in the last 168 hours. Lipid Profile: No results for input(s): CHOL, HDL, LDLCALC, TRIG, CHOLHDL, LDLDIRECT in the last 72 hours. Thyroid Function Tests: No results for input(s): TSH, T4TOTAL, FREET4, T3FREE, THYROIDAB in the last 72 hours. Anemia Panel: No results for input(s): VITAMINB12, FOLATE, FERRITIN, TIBC, IRON, RETICCTPCT in the last 72 hours. Urine analysis:    Component Value Date/Time   COLORURINE YELLOW 12/03/2020 1303   APPEARANCEUR HAZY (A) 12/03/2020 1303   LABSPEC 1.010 12/03/2020 1303   PHURINE 7.5 12/03/2020 1303   GLUCOSEU NEGATIVE 12/03/2020 1303   HGBUR TRACE (A) 12/03/2020 1303   BILIRUBINUR NEGATIVE 12/03/2020 1303   KETONESUR NEGATIVE 12/03/2020 1303   PROTEINUR NEGATIVE 12/03/2020 1303   NITRITE NEGATIVE 12/03/2020 1303   LEUKOCYTESUR SMALL (A) 12/03/2020 1303   Sepsis Labs: @LABRCNTIP (procalcitonin:4,lacticidven:4) ) Recent Results (from the past 240 hour(s))  SARS Coronavirus 2 (TAT 6-24 hrs)     Status: None   Collection Time: 11/26/20 12:00 AM  Result Value Ref Range  Status   SARS Coronavirus 2 RESULT: NEGATIVE  Final    Comment: RESULT: NEGATIVESARS-CoV-2 INTERPRETATION:A NEGATIVE  test result means that SARS-CoV-2 RNA was not present in the specimen above the limit of detection of this test. This does not preclude a possible SARS-CoV-2 infection and should not be used as the  sole basis for patient management decisions. Negative results must be combined with clinical observations, patient history, and epidemiological information. Optimum specimen types and timing for peak viral levels during infections caused by SARS-CoV-2  have not been determined. Collection of multiple specimens or types of specimens may be necessary to detect virus. Improper specimen collection and handling, sequence variability under primers/probes, or organism present below the limit of detection may  lead to false negative results. Positive and negative predictive values of testing are highly dependent on prevalence. False negative test results are more likely when prevalence of disease is high.The expected result is NEGATIVE.Fact S heet for  Healthcare Providers: 01/26/21 Sheet for Patients: CollegeCustoms.gl Reference Range - Negative   Resp Panel by RT-PCR (Flu A&B, Covid) Nasopharyngeal Swab     Status: None   Collection Time: 12/03/20  8:22 PM   Specimen: Nasopharyngeal Swab; Nasopharyngeal(NP) swabs in vial transport medium  Result Value Ref Range Status   SARS Coronavirus 2 by RT PCR NEGATIVE NEGATIVE Final    Comment: (NOTE) SARS-CoV-2 target nucleic acids are NOT DETECTED.  The SARS-CoV-2 RNA is generally detectable in upper respiratory specimens during the acute phase of infection. The lowest concentration of SARS-CoV-2 viral copies this assay can detect is 138 copies/mL. A negative result does not preclude SARS-Cov-2 infection and should not be used as the sole basis for treatment or other patient  management decisions. A negative result may occur with  improper specimen collection/handling, submission of specimen other than nasopharyngeal swab, presence of viral mutation(s) within the areas targeted by this assay, and inadequate number of viral copies(<138 copies/mL). A negative result must be combined with clinical observations, patient history, and epidemiological information. The expected result is Negative.  Fact Sheet for Patients:  02/02/21  Fact Sheet for Healthcare Providers:  BloggerCourse.com  This test is no t yet approved or cleared by the [employer] FDA and  has been authorized for detection  and/or diagnosis of SARS-CoV-2 by FDA under an Emergency Use Authorization (EUA). This EUA will remain  in effect (meaning this test can be used) for the duration of the COVID-19 declaration under Section 564(b)(1) of the Act, 21 U.S.C.section 360bbb-3(b)(1), unless the authorization is terminated  or revoked sooner.       Influenza A by PCR NEGATIVE NEGATIVE Final   Influenza B by PCR NEGATIVE NEGATIVE Final    Comment: (NOTE) The Xpert Xpress SARS-CoV-2/FLU/RSV plus assay is intended as an aid in the diagnosis of influenza from Nasopharyngeal swab specimens and should not be used as a sole basis for treatment. Nasal washings and aspirates are unacceptable for Xpert Xpress SARS-CoV-2/FLU/RSV testing.  Fact Sheet for Patients: BloggerCourse.com  Fact Sheet for Healthcare Providers: SeriousBroker.it  This test is not yet approved or cleared by the [place] FDA and has been authorized for detection and/or diagnosis of SARS-CoV-2 by FDA under an Emergency Use Authorization (EUA). This EUA will remain in effect (meaning this test can be used) for the duration of the COVID-19 declaration under Section 564(b)(1) of the Act, 21 U.S.C. section 360bbb-3(b)(1),  unless the authorization is terminated or revoked.  Performed at [facility], [address] [place], [place] [address]      Radiological Exams on Admission: CT ABDOMEN PELVIS W CONTRAST  Result Date: 12/03/2020 CLINICAL DATA:  Abdominal pain, nausea vomiting and constipation EXAM: CT ABDOMEN AND PELVIS WITH CONTRAST TECHNIQUE: Multidetector CT imaging of the abdomen and pelvis was performed using the standard protocol following bolus administration of intravenous contrast. CONTRAST:  73mL OMNIPAQUE IOHEXOL 300 MG/ML  SOLN COMPARISON:  None. FINDINGS: Lower chest: No acute abnormality. Hepatobiliary: Hypodense lesions in the left hepatic lobe measuring up to 1.8 x 1.5 cm, incompletely characterized but statistically likely to represent benign cysts or hemangiomas. Gallbladder is unremarkable. No biliary ductal dilation. Pancreas: Within normal limits. Spleen: Within normal limits. Adrenals/Urinary Tract: Adrenal glands are unremarkable. Kidneys are normal, without renal calculi, solid enhancing lesion, or hydronephrosis. There is gas in a distended urinary bladder, recommend correlation with recent history of instrumentation. Stomach/Bowel: Small hiatal hernia otherwise the stomach is unremarkable for degree of distension. Normal positioning of the duodenum/ligament of Treitz. Fecalized loops of prominent distal small bowel without pathologic dilation of small bowel. The appendix and terminal ileum appear normal. Colonic diverticulosis without findings of acute diverticulitis. Sigmoid colonic anastomotic sutures. Moderate volume of stool in the colon. Vascular/Lymphatic: Aorta bi-iliac atherosclerotic calcifications without abdominal aortic aneurysm. No pathologically enlarged abdominal or pelvic lymph nodes. 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 No suspicious adnexal mass. Other: No abdominopelvic ascites. Musculoskeletal: Severe multilevel degenerative  changes spine with multifocal neural foraminal and canal narrowing in the lower lumbar spine. Levoconvex curvature of the lumbar spine. Right total hip arthroplasty. Degenerative change of the left hip. No acute osseous abnormality. IMPRESSION: 1. Moderate volume of formed stool throughout the colon with fecalized loops of prominent distal small bowel without pathologic dilation. Findings may reflect slow transit/constipation. 2. Colonic diverticulosis without findings of acute diverticulitis. 3. Severe multilevel degenerative changes spine with multifocal neural foraminal and spinal canal narrowing in the lower lumbar spine. 4. Gas in a distended urinary bladder, recommend correlation with recent history of instrumentation. 5. Hypodense lesions in the left hepatic lobe measuring up to 1.8 x 1.5 cm, incompletely characterized but statistically likely to represent benign cysts or hemangiomas in the absence of known malignancy. Consider more definitive characterization with nonemergent outpatient hepatic protocol  MRI with and without contrast if clinically indicated. 6.  Aortic Atherosclerosis (ICD10-I70.0). Electronically Signed   By: Maudry Mayhew MD   On: 12/03/2020 16:49    EKG: Independently reviewed.  Normal sinus rhythm.  Assessment/Plan Principal Problem:   Abdominal pain Active Problems:   Hyponatremia   Atrial fibrillation (HCC)   Essential hypertension   Hypothyroidism    Abdominal pain with nausea vomiting and had not moved her bowels for last 4 days likely could be developing ileus.  We will keep patient on full liquid diet antiemetics and I have ordered 1 dose of Dulcolax suppository.  Avoid narcotics.  Closely monitor electrolytes. Hyponatremia -patient does have a history of chronic hyponatremia which is acutely worsened.  Could be from vomiting.  We will hold patient's spironolactone at this time.  Patient was started on normal saline fluid by the ER physician we will repeat metabolic  panel closely follow sodium trends and check TSH.  Check urine studies. History of atrial fibrillation not on anticoagulation per patient's request per patient's cardiology notes.  Patient is presently on Xarelto for DVT prophylaxis.  Patient takes sotalol and Cardizem for A. fib.  Which will be continued. Hypertension on Cozaar, Cardizem and hydralazine.  As needed IV labetalol for systolic more than 160. Hypothyroidism on Synthroid check TSH. Diabetes mellitus type 2 recent hemoglobin A1c was 6.6 on July 27.  We will keep patient on sliding scale coverage. Recent right knee replacement please let Dr. Claudie Leach orthopedic surgeon know about admission. Urinary retention -patient had Foley catheter placed in the ER.   DVT prophylaxis: Xarelto for DVT prophylaxis. Code Status: Full code. Family Communication: We will need to discuss with family. Disposition Plan: To be determined. Consults called: None. Admission status: Observation.   Eduard Clos MD [facility] Pager [phone].  If 7PM-7AM, please contact night-coverage [email or web address] Password [facility]  12/04/2020, 2:49 AM

## 2020-12-04 NOTE — Progress Notes (Signed)
Patient arrived to 72. Paged Admitting.

## 2020-12-05 DIAGNOSIS — Z9851 Tubal ligation status: Secondary | ICD-10-CM | POA: Diagnosis not present

## 2020-12-05 DIAGNOSIS — I4891 Unspecified atrial fibrillation: Secondary | ICD-10-CM | POA: Diagnosis present

## 2020-12-05 DIAGNOSIS — Z20822 Contact with and (suspected) exposure to covid-19: Secondary | ICD-10-CM | POA: Diagnosis present

## 2020-12-05 DIAGNOSIS — K567 Ileus, unspecified: Secondary | ICD-10-CM | POA: Diagnosis present

## 2020-12-05 DIAGNOSIS — Z79890 Hormone replacement therapy (postmenopausal): Secondary | ICD-10-CM | POA: Diagnosis not present

## 2020-12-05 DIAGNOSIS — E871 Hypo-osmolality and hyponatremia: Secondary | ICD-10-CM | POA: Diagnosis present

## 2020-12-05 DIAGNOSIS — E876 Hypokalemia: Secondary | ICD-10-CM | POA: Diagnosis not present

## 2020-12-05 DIAGNOSIS — I10 Essential (primary) hypertension: Secondary | ICD-10-CM | POA: Diagnosis present

## 2020-12-05 DIAGNOSIS — R339 Retention of urine, unspecified: Secondary | ICD-10-CM | POA: Diagnosis present

## 2020-12-05 DIAGNOSIS — R1084 Generalized abdominal pain: Secondary | ICD-10-CM | POA: Diagnosis not present

## 2020-12-05 DIAGNOSIS — E119 Type 2 diabetes mellitus without complications: Secondary | ICD-10-CM | POA: Diagnosis present

## 2020-12-05 DIAGNOSIS — Z7901 Long term (current) use of anticoagulants: Secondary | ICD-10-CM | POA: Diagnosis not present

## 2020-12-05 DIAGNOSIS — Z79899 Other long term (current) drug therapy: Secondary | ICD-10-CM | POA: Diagnosis not present

## 2020-12-05 DIAGNOSIS — E890 Postprocedural hypothyroidism: Secondary | ICD-10-CM | POA: Diagnosis present

## 2020-12-05 DIAGNOSIS — Z96651 Presence of right artificial knee joint: Secondary | ICD-10-CM | POA: Diagnosis present

## 2020-12-05 DIAGNOSIS — Z96641 Presence of right artificial hip joint: Secondary | ICD-10-CM | POA: Diagnosis present

## 2020-12-05 LAB — BASIC METABOLIC PANEL
Anion gap: 9 (ref 5–15)
BUN: 16 mg/dL (ref 8–23)
CO2: 25 mmol/L (ref 22–32)
Calcium: 7.2 mg/dL — ABNORMAL LOW (ref 8.9–10.3)
Chloride: 91 mmol/L — ABNORMAL LOW (ref 98–111)
Creatinine, Ser: 0.6 mg/dL (ref 0.44–1.00)
GFR, Estimated: >60 mL/min (ref >60)
Glucose, Bld: 116 mg/dL — ABNORMAL HIGH (ref 70–99)
Potassium: 3.1 mmol/L — ABNORMAL LOW (ref 3.5–5.1)
Sodium: 125 mmol/L — ABNORMAL LOW (ref 135–145)

## 2020-12-05 LAB — SODIUM, URINE, RANDOM: Sodium, Ur: 18 mmol/L

## 2020-12-05 LAB — GLUCOSE, CAPILLARY
Glucose-Capillary: 146 mg/dL — ABNORMAL HIGH (ref 70–99)
Glucose-Capillary: 152 mg/dL — ABNORMAL HIGH (ref 70–99)
Glucose-Capillary: 136 mg/dL — ABNORMAL HIGH (ref 70–99)
Glucose-Capillary: 216 mg/dL — ABNORMAL HIGH (ref 70–99)

## 2020-12-05 LAB — OSMOLALITY, URINE: Osmolality, Ur: 370 mOsm/kg (ref 300–900)

## 2020-12-05 LAB — MAGNESIUM: Magnesium: 1.9 mg/dL (ref 1.7–2.4)

## 2020-12-05 MED ORDER — LIP MEDEX EX OINT
TOPICAL_OINTMENT | CUTANEOUS | Status: AC
  Administered 2020-12-05: 1 via TOPICAL
  Filled 2020-12-05: qty 7

## 2020-12-05 MED ORDER — PSYLLIUM 95 % PO PACK
1.0000 | PACK | Freq: Two times a day (BID) | ORAL | Status: DC
  Administered 2020-12-05: 1 via ORAL
  Filled 2020-12-05 (×3): qty 1

## 2020-12-05 MED ORDER — POTASSIUM CHLORIDE CRYS ER 20 MEQ PO TBCR
40.0000 meq | EXTENDED_RELEASE_TABLET | ORAL | Status: AC
  Administered 2020-12-05 (×2): 40 meq via ORAL
  Filled 2020-12-05 (×2): qty 2

## 2020-12-05 MED ORDER — CALCIUM POLYCARBOPHIL 625 MG PO TABS
625.0000 mg | ORAL_TABLET | Freq: Every day | ORAL | Status: DC
  Administered 2020-12-05 – 2020-12-06 (×2): 625 mg via ORAL
  Filled 2020-12-05 (×2): qty 1

## 2020-12-05 NOTE — Plan of Care (Signed)
  Problem: Clinical Measurements: Goal: Diagnostic test results will improve Outcome: Progressing   Problem: Clinical Measurements: Goal: Respiratory complications will improve Outcome: Progressing   Problem: Clinical Measurements: Goal: Cardiovascular complication will be avoided Outcome: Progressing   

## 2020-12-05 NOTE — Plan of Care (Signed)
  Problem: Education: Goal: Knowledge of General Education information will improve Description: Including pain rating scale, medication(s)/side effects and non-pharmacologic comfort measures Outcome: Progressing   Problem: Activity: Goal: Risk for activity intolerance will decrease Outcome: Progressing   Problem: Nutrition: Goal: Adequate nutrition will be maintained Outcome: Progressing   

## 2020-12-05 NOTE — Progress Notes (Signed)
Physical Therapy Treatment Patient Details Name: Kathryn [name] MRN: [id number] DOB: Feb 23, 1941 Today's Date: 12/05/2020    History of Present Illness Patient is 80 y.o. female s/p Rt TKA on 11/30/20 with PMH significant for OA, HTN, Hypothyroidism, Rt THA in 2017. Pt discharged on 8/9 after surgery and returned on 8/11 due to ~2 days of persistent nausea vomiting and abdominal discomfort.  In ED, CT of abdomen/pelvis shows features concerning for slow transit/constipation. Patient also had urinary retention for which patient was placed on Foley catheter.    PT Comments    Progressing with mobility. Encouraged pt to work on ROM exercises as tolerated. She reported she has been walking with nursing as able.     Follow Up Recommendations  Outpatient PT     Equipment Recommendations  None recommended by PT    Recommendations for Other Services       Precautions / Restrictions Precautions Precautions: Fall;Knee Restrictions Weight Bearing Restrictions: No RLE Weight Bearing: Weight bearing as tolerated    Mobility  Bed Mobility               General bed mobility comments: oob in recliner    Transfers Overall transfer level: Needs assistance Equipment used: Rolling walker (2 wheeled) Transfers: Sit to/from Stand Sit to Stand: Supervision         General transfer comment: cues for safety, hand placement.  Ambulation/Gait Ambulation/Gait assistance: Supervision Gait Distance (Feet): 115 Feet Assistive device: Rolling walker (2 wheeled) Gait Pattern/deviations: Step-to pattern;Step-through pattern;Decreased stride length     General Gait Details: Progressing well with mobility. Beginning to work on step thru gait patterns. Cues for safety, posture, step lenth, RW proximity.   Stairs             Wheelchair Mobility    Modified Rankin (Stroke Patients Only)       Balance Overall balance assessment: Needs assistance         Standing balance  support: Bilateral upper extremity supported Standing balance-Leahy Scale: Poor                              Cognition Arousal/Alertness: Awake/alert Behavior During Therapy: WFL for tasks assessed/performed Overall Cognitive Status: Within Functional Limits for tasks assessed                                        Exercises Total Joint Exercises Ankle Circles/Pumps: AROM;Both;10 reps Quad Sets: AROM;Both;10 reps Heel Slides: AAROM;Right;5 reps Hip ABduction/ADduction: AROM;Right;10 reps Straight Leg Raises: AROM;Right;10 reps Knee Flexion: AAROM;Right;10 reps;Seated Goniometric ROM: ~10-70 degrees    General Comments        Pertinent Vitals/Pain Pain Assessment: 0-10 Pain Score: 5  Pain Location: R knee Pain Descriptors / Indicators: Discomfort;Sore Pain Intervention(s): Limited activity within patient's tolerance;Monitored during session;Ice applied;Repositioned    Home Living                      Prior Function            PT Goals (current goals can now be found in the care plan section) Progress towards PT goals: Progressing toward goals    Frequency    7X/week      PT Plan Current plan remains appropriate    Co-evaluation  AM-PAC PT "6 Clicks" Mobility   Outcome Measure  Help needed turning from your back to your side while in a flat bed without using bedrails?: None Help needed moving from lying on your back to sitting on the side of a flat bed without using bedrails?: None Help needed moving to and from a bed to a chair (including a wheelchair)?: A Little Help needed standing up from a chair using your arms (e.g., wheelchair or bedside chair)?: A Little Help needed to walk in hospital room?: A Little Help needed climbing 3-5 steps with a railing? : A Little 6 Click Score: 20    End of Session Equipment Utilized During Treatment: Gait belt Activity Tolerance: Patient tolerated treatment  well Patient left: in chair;with call bell/phone within reach;with family/visitor present   PT Visit Diagnosis: Pain;Other abnormalities of gait and mobility (R26.89) Pain - Right/Left: Right Pain - part of body: Knee     Time: 7035-0093 PT Time Calculation (min) (ACUTE ONLY): 22 min  Charges:  $Gait Training: 8-22 mins                         Faye Ramsay, PT Acute Rehabilitation  Office: [phone] Pager: [phone]

## 2020-12-05 NOTE — Progress Notes (Signed)
PROGRESS NOTE    Kathryn [name]  MVH:[id number] DOB: July 02, 1940 DOA: 12/03/2020 PCP: Leola Brazil, DO     Brief Narrative:  Kathryn [name] is a 80 y.o. 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  Has not moved her bowels for last 4 days.  CT abdomen pelvis shows features concerning for slow transit/constipation.  Patient had urinary retention for which patient was placed on Foley catheter.  New events last 24 hours / Subjective: Doing better physically, tolerating soft diet, has had 3 bowel movements so far.  Has been ambulating in the hallway.  She really wants to go home today, however her sodium level has worsened overnight.  Assessment & Plan:   Principal Problem:   Abdominal pain Active Problems:   Hyponatremia   Atrial fibrillation (HCC)   Essential hypertension   Hypothyroidism   Ileus -Resolved, continue bowel regimen including Colace, Metamucil, fiber  Hyponatremia -Seems to be chronically in the 130s -Appears to be euvolemic on examination -TSH normal -Worsened overnight -Check urine sodium, urine osmol, serum osmol, serum cortisol  -Hold spironolactone, Cozaar  Hypertension -Hold spironolactone, Cozaar in setting of hyponatremia -Continue Cardizem, hydralazine  Hypokalemia -Replace, trend  Diabetes mellitus -Continue sliding scale insulin  Hypothyroidism -Continue Synthroid  Acute urinary retention -Foley catheter was removed 8/12  Status post right total knee replacement -Surgical site looks good -Follow-up with Dr. Lequita Halt -Continue PT -On Xarelto for DVT prophylaxis  DVT prophylaxis:  rivaroxaban (XARELTO) tablet 10 mg Start: 12/04/20 1200 rivaroxaban (XARELTO) tablet 10 mg  Code Status:     Code Status Orders  (From  admission, onward)           Start     Ordered   12/04/20 0248  Full code  Continuous        12/04/20 0249           Code Status History     Date Active Date Inactive Code Status Order ID Comments User Context   11/30/2020 1431 12/01/2020 2114 Full Code [id number]  Derenda Fennel, PA Inpatient      Advance Directive Documentation    Flowsheet Row Most Recent Value  Type of Advance Directive Healthcare Power of Attorney, Living will  Pre-existing out of facility DNR order (yellow form or pink MOST form) --  "MOST" Form in Place? --      Family Communication: Son at bedside Disposition Plan:  Status is: Observation  The patient will require care spanning > 2 midnights and should be moved to inpatient because: Inpatient level of care appropriate due to severity of illness  Dispo: The patient is from: Home              Anticipated d/c is to: Home              Patient currently is not medically stable to d/c.   Difficult to place patient No      Consultants:  None  Procedures:  None   Antimicrobials:  Anti-infectives (From admission, onward)    None        Objective: Vitals:   12/04/20 2015 12/04/20 2016 12/05/20 0037 12/05/20 0526  BP: (!) 141/70  (!) 129/57 (!) 173/76  Pulse: 70 72 63 71  Resp: 18  20 18   Temp:  98.2 F (36.8 C)    TempSrc:  Oral    SpO2: 98% 99% 100% 98%  Weight:      Height:        Intake/Output Summary (Last 24 hours) at 12/05/2020 1059 Last data filed at 12/05/2020 1035 Gross per 24 hour  Intake 960 ml  Output 502 ml  Net 458 ml   Filed Weights   12/04/20 0022  Weight: 57.1 kg    Examination:  General exam: Appears calm and comfortable  Respiratory system: Clear to auscultation. Respiratory effort normal. No respiratory distress. No conversational dyspnea.  Cardiovascular system: S1 & S2 heard, RRR. No murmurs. No pedal edema. Gastrointestinal system: Abdomen is nondistended, soft and nontender. Normal bowel  sounds heard. Central nervous system: Alert and oriented. No focal neurological deficits. Speech clear.  Extremities: Mild edema right knee  Skin: No rashes, lesions or ulcers on exposed skin  Psychiatry: Judgement and insight appear normal. Mood & affect appropriate.   Data Reviewed: I have personally reviewed following labs and imaging studies  CBC: Recent Labs  Lab 12/01/20 0326 12/03/20 1303 12/04/20 0351  WBC 10.0 11.6* 9.7  NEUTROABS  --   --  7.2  HGB 11.9* 12.8 12.6  HCT 34.2* 35.1* 36.3  MCV 99.7 95.1 97.6  PLT 245 301 307   Basic Metabolic Panel: Recent Labs  Lab 12/03/20 1303 12/03/20 1823 12/04/20 0351 12/04/20 1024 12/05/20 0345 12/05/20 0714  NA 125* 125* 128* 127* 125*  --   K 3.5 3.0* 3.5 3.5 3.1*  --   CL 85* 90* 92* 92* 91*  --   CO2 26 24 25 25 25   --   GLUCOSE 168* 156* 144* 238* 116*  --   BUN 12 11 12 15 16   --   CREATININE 0.54 0.47 0.63 0.69 0.60  --   CALCIUM 8.2* 7.1* 7.6* 7.4* 7.2*  --   MG  --   --   --   --   --  1.9   GFR: Estimated Creatinine Clearance: 41.6 mL/min (by C-G formula based on SCr of 0.6 mg/dL). Liver Function Tests: Recent Labs  Lab 12/03/20 1303  AST 24  ALT 22  ALKPHOS 96  BILITOT 0.8  PROT 7.2  ALBUMIN 3.8   Recent Labs  Lab 12/03/20 1303  LIPASE 37   No results for input(s): AMMONIA in the last 168 hours. Coagulation Profile: No results for input(s): INR, PROTIME in the last 168 hours. Cardiac Enzymes: No results for input(s): CKTOTAL, CKMB, CKMBINDEX, TROPONINI in the last 168 hours. BNP (last 3 results) No results for input(s): PROBNP in the last 8760 hours. HbA1C: No results for input(s): HGBA1C in the last 72 hours. CBG: Recent Labs  Lab 12/04/20 0800 12/04/20 1158 12/04/20 1626 12/04/20 2018 12/05/20 0755  GLUCAP 171* 179* 149* 160* 146*   Lipid Profile: No results for input(s): CHOL, HDL, LDLCALC, TRIG, CHOLHDL, LDLDIRECT in the last 72 hours. Thyroid Function Tests: Recent Labs     12/04/20 0351  TSH 0.941   Anemia Panel: No results for input(s): VITAMINB12, FOLATE, FERRITIN, TIBC, IRON, RETICCTPCT in the last 72 hours. Sepsis Labs: No results for input(s): PROCALCITON, LATICACIDVEN in the last 168 hours.  Recent Results (from the past 240 hour(s))  SARS Coronavirus 2 (TAT 6-24 hrs)     Status: None   Collection Time: 11/26/20 12:00 AM  Result Value Ref Range Status   SARS Coronavirus 2 RESULT: NEGATIVE  Final    Comment: RESULT: NEGATIVESARS-CoV-2 INTERPRETATION:A NEGATIVE  test result means that SARS-CoV-2 RNA  was not present in the specimen above the limit of detection of this test. This does not preclude a possible SARS-CoV-2 infection and should not be used as the  sole basis for patient management decisions. Negative results must be combined with clinical observations, patient history, and epidemiological information. Optimum specimen types and timing for peak viral levels during infections caused by SARS-CoV-2  have not been determined. Collection of multiple specimens or types of specimens may be necessary to detect virus. Improper specimen collection and handling, sequence variability under primers/probes, or organism present below the limit of detection may  lead to false negative results. Positive and negative predictive values of testing are highly dependent on prevalence. False negative test results are more likely when prevalence of disease is high.The expected result is NEGATIVE.Fact S heet for  Healthcare Providers: CollegeCustoms.gl Sheet for Patients: https://poole-freeman.org/ Reference Range - Negative   Urine Culture     Status: None (Preliminary result)   Collection Time: 12/03/20  5:25 PM   Specimen: Urine, Clean Catch  Result Value Ref Range Status   Specimen Description   Final    URINE, CLEAN CATCH Performed at [facility], [address] [place], [place] [address]    Special  Requests   Final    NONE Performed at [facility], [address] [place], [place] [address]    Culture   Final    CULTURE REINCUBATED FOR BETTER GROWTH Performed at [facility], [address] [place], [place] [address]    Report Status PENDING  Incomplete  Resp Panel by RT-PCR (Flu A&B, Covid) Nasopharyngeal Swab     Status: None   Collection Time: 12/03/20  8:22 PM   Specimen: Nasopharyngeal Swab; Nasopharyngeal(NP) swabs in vial transport medium  Result Value Ref Range Status   SARS Coronavirus 2 by RT PCR NEGATIVE NEGATIVE Final    Comment: (NOTE) SARS-CoV-2 target nucleic acids are NOT DETECTED.  The SARS-CoV-2 RNA is generally detectable in upper respiratory specimens during the acute phase of infection. The lowest concentration of SARS-CoV-2 viral copies this assay can detect is 138 copies/mL. A negative result does not preclude SARS-Cov-2 infection and should not be used as the sole basis for treatment or other patient management decisions. A negative result may occur with  improper specimen collection/handling, submission of specimen other than nasopharyngeal swab, presence of viral mutation(s) within the areas targeted by this assay, and inadequate number of viral copies(<138 copies/mL). A negative result must be combined with clinical observations, patient history, and epidemiological information. The expected result is Negative.  Fact Sheet for Patients:  BloggerCourse.com  Fact Sheet for Healthcare Providers:  SeriousBroker.it  This test is no t yet approved or cleared by the [place] FDA and  has been authorized for detection and/or diagnosis of SARS-CoV-2 by FDA under an Emergency Use Authorization (EUA). This EUA will remain  in effect (meaning this test can be used) for the duration of the COVID-19 declaration under Section 564(b)(1) of the Act, 21 U.S.C.section 360bbb-3(b)(1), unless  the authorization is terminated  or revoked sooner.       Influenza A by PCR NEGATIVE NEGATIVE Final   Influenza B by PCR NEGATIVE NEGATIVE Final    Comment: (NOTE) The Xpert Xpress SARS-CoV-2/FLU/RSV plus assay is intended as an aid in the diagnosis of influenza from Nasopharyngeal swab specimens and should not be used as a sole basis for treatment. Nasal washings and aspirates are unacceptable for Xpert Xpress  SARS-CoV-2/FLU/RSV testing.  Fact Sheet for Patients: BloggerCourse.comhttps://www.fda.gov/media/152166/download  Fact Sheet for Healthcare Providers: SeriousBroker.ithttps://www.fda.gov/media/152162/download  This test is not yet approved or cleared by the [place] FDA and has been authorized for detection and/or diagnosis of SARS-CoV-2 by FDA under an Emergency Use Authorization (EUA). This EUA will remain in effect (meaning this test can be used) for the duration of the COVID-19 declaration under Section 564(b)(1) of the Act, 21 U.S.C. section 360bbb-3(b)(1), unless the authorization is terminated or revoked.  Performed at [facility] [place] [facility], [address] [place], [place] [address]       Radiology Studies: CT ABDOMEN PELVIS W CONTRAST  Result Date: 12/03/2020 CLINICAL DATA:  Abdominal pain, nausea vomiting and constipation EXAM: CT ABDOMEN AND PELVIS WITH CONTRAST TECHNIQUE: Multidetector CT imaging of the abdomen and pelvis was performed using the standard protocol following bolus administration of intravenous contrast. CONTRAST:  75mL OMNIPAQUE IOHEXOL 300 MG/ML  SOLN COMPARISON:  None. FINDINGS: Lower chest: No acute abnormality. Hepatobiliary: Hypodense lesions in the left hepatic lobe measuring up to 1.8 x 1.5 cm, incompletely characterized but statistically likely to represent benign cysts or hemangiomas. Gallbladder is unremarkable. No biliary ductal dilation. Pancreas: Within normal limits. Spleen: Within normal limits. Adrenals/Urinary Tract: Adrenal glands are unremarkable.  Kidneys are normal, without renal calculi, solid enhancing lesion, or hydronephrosis. There is gas in a distended urinary bladder, recommend correlation with recent history of instrumentation. Stomach/Bowel: Small hiatal hernia otherwise the stomach is unremarkable for degree of distension. Normal positioning of the duodenum/ligament of Treitz. Fecalized loops of prominent distal small bowel without pathologic dilation of small bowel. The appendix and terminal ileum appear normal. Colonic diverticulosis without findings of acute diverticulitis. Sigmoid colonic anastomotic sutures. Moderate volume of stool in the colon. Vascular/Lymphatic: Aorta bi-iliac atherosclerotic calcifications without abdominal aortic aneurysm. No pathologically enlarged abdominal or pelvic lymph nodes. 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 No suspicious adnexal mass. Other: No abdominopelvic ascites. Musculoskeletal: Severe multilevel degenerative changes spine with multifocal neural foraminal and canal narrowing in the lower lumbar spine. Levoconvex curvature of the lumbar spine. Right total hip arthroplasty. Degenerative change of the left hip. No acute osseous abnormality. IMPRESSION: 1. Moderate volume of formed stool throughout the colon with fecalized loops of prominent distal small bowel without pathologic dilation. Findings may reflect slow transit/constipation. 2. Colonic diverticulosis without findings of acute diverticulitis. 3. Severe multilevel degenerative changes spine with multifocal neural foraminal and spinal canal narrowing in the lower lumbar spine. 4. Gas in a distended urinary bladder, recommend correlation with recent history of instrumentation. 5. Hypodense lesions in the left hepatic lobe measuring up to 1.8 x 1.5 cm, incompletely characterized but statistically likely to represent benign cysts or hemangiomas in the absence of known malignancy. Consider more definitive  characterization with nonemergent outpatient hepatic protocol MRI with and without contrast if clinically indicated. 6.  Aortic Atherosclerosis (ICD10-I70.0). Electronically Signed   By: Maudry MayhewJeffrey  Waltz MD   On: 12/03/2020 16:49      Scheduled Meds:  diltiazem  240 mg Oral Daily   docusate sodium  100 mg Oral BID   gabapentin  300 mg Oral 2 times per day   gabapentin  600 mg Oral QHS   hydrALAZINE  50 mg Oral TID   insulin aspart  0-6 Units Subcutaneous TID WC   levothyroxine  100 mcg Oral Q0600   LORazepam  0.5 mg Oral QHS   pantoprazole  40 mg Oral Daily   polycarbophil  625 mg Oral  Daily   potassium chloride  40 mEq Oral Q4H   psyllium  1 packet Oral BID   rivaroxaban  10 mg Oral Daily   sotalol  80 mg Oral BID   Continuous Infusions:   LOS: 0 days      Time spent: 30 minutes   Noralee Stain, DO [facility] 12/05/2020, 10:59 AM   Available via Epic secure chat 7am-7pm After these hours, please refer to coverage provider listed on [email or web address]

## 2020-12-06 LAB — URINE CULTURE: Culture: 20000 — AB

## 2020-12-06 LAB — BASIC METABOLIC PANEL
Anion gap: 10 (ref 5–15)
BUN: 15 mg/dL (ref 8–23)
CO2: 24 mmol/L (ref 22–32)
Calcium: 7.5 mg/dL — ABNORMAL LOW (ref 8.9–10.3)
Chloride: 94 mmol/L — ABNORMAL LOW (ref 98–111)
Creatinine, Ser: 0.6 mg/dL (ref 0.44–1.00)
GFR, Estimated: >60 mL/min (ref >60)
Glucose, Bld: 129 mg/dL — ABNORMAL HIGH (ref 70–99)
Potassium: 4.4 mmol/L (ref 3.5–5.1)
Sodium: 128 mmol/L — ABNORMAL LOW (ref 135–145)

## 2020-12-06 LAB — OSMOLALITY: Osmolality: 271 mOsm/kg — ABNORMAL LOW (ref 275–295)

## 2020-12-06 LAB — GLUCOSE, CAPILLARY: Glucose-Capillary: 132 mg/dL — ABNORMAL HIGH (ref 70–99)

## 2020-12-06 LAB — CORTISOL: Cortisol, Plasma: 11.6 ug/dL

## 2020-12-06 MED ORDER — HYDRALAZINE HCL 50 MG PO TABS: 50.0000 mg | ORAL_TABLET | Freq: Three times a day (TID) | ORAL | 2 refills | Status: AC

## 2020-12-06 NOTE — Progress Notes (Signed)
Pt stable at time of d/c instructions and education. No needs at time of d/c. Pt dressing clean, dry , and intact. Rn will continue to monitor.

## 2020-12-06 NOTE — Plan of Care (Signed)
  Problem: Clinical Measurements: Goal: Diagnostic test results will improve Outcome: Progressing   Problem: Clinical Measurements: Goal: Respiratory complications will improve Outcome: Progressing   Problem: Clinical Measurements: Goal: Cardiovascular complication will be avoided Outcome: Progressing   Problem: Elimination: Goal: Will not experience complications related to bowel motility Outcome: Progressing   

## 2020-12-06 NOTE — Discharge Summary (Addendum)
Physician Discharge Summary  Kathryn [name]:[id number] DOB: Oct 27, 1940 DOA: 12/03/2020  PCP: Leola Brazil, DO  Admit date: 12/03/2020 Discharge date: 12/06/2020  Admitted From: Home Disposition: Home  Recommendations for Outpatient Follow-up:  Follow up with PCP in 1 week Follow up with orthopedic surgery as scheduled  Continue outpatient physical therapy Repeat BMP in 1 week to check hyponatremia.  Patient's spironolactone and losartan have been discontinued. Serum osmol, serum cortisol pending at this time  Discharge Condition: Stable, improved CODE STATUS: Full code Diet recommendation: Carb modified diet  Brief/Interim Summary: Kathryn [name] is a 80 y.o. 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  Has not moved her bowels for last 4 days.  CT abdomen pelvis shows features concerning for slow transit/constipation.  Patient had urinary retention for which patient was placed on Foley catheter.  Patient continued to improve with supportive care, ileus resolved and patient was tolerating diet and having bowel movements.  Foley catheter was removed and patient was able to void.  Work-up for hyponatremia has been largely unremarkable, spironolactone as well as losartan were discontinued during hospitalization.  Hydralazine dose was increased for hypertension.  Discharge Diagnoses:  Principal Problem:   Abdominal pain Active Problems:   Hyponatremia   Atrial fibrillation (HCC)   Essential hypertension   Hypothyroidism   Ileus -Resolved, continue bowel regimen including Colace, Metamucil, fiber   Hyponatremia -Seems to be chronically in the 130s -Appears to be euvolemic on examination -TSH normal -Urine sodium 18, urine osmol 370, not consistent with  SIADH -Serum osmol, serum cortisol pending at this time -Hold spironolactone, Cozaar -Improved overnight   Hypertension -Hold spironolactone, Cozaar in setting of hyponatremia -Continue Cardizem, hydralazine    Diabetes mellitus -SSI during hospitalization    Hypothyroidism -Continue Synthroid   Acute urinary retention -Foley catheter was removed 8/12   Status post right total knee replacement -Surgical site looks good -Follow-up with Dr. Lequita Halt -Continue PT -On Xarelto for DVT prophylaxis  Discharge Instructions  Discharge Instructions     Call MD for:  difficulty breathing, headache or visual disturbances   Complete by: As directed    Call MD for:  extreme fatigue   Complete by: As directed    Call MD for:  persistant dizziness or light-headedness   Complete by: As directed    Call MD for:  persistant nausea and vomiting   Complete by: As directed    Call MD for:  redness, tenderness, or signs of infection (pain, swelling, redness, odor or green/yellow discharge around incision site)   Complete by: As directed    Call MD for:  severe uncontrolled pain   Complete by: As directed    Call MD for:  temperature >100.4   Complete by: As directed    Discharge instructions   Complete by: As directed    You were cared for by a hospitalist during your hospital stay. If you have any questions about your discharge medications or the care you received while you were in the hospital after you are discharged, you can call the unit and ask to speak with the hospitalist on call if the hospitalist that took care of you is not available. Once you are discharged, your primary care physician will handle any further medical issues. Please note that NO REFILLS for any discharge medications will be authorized  once you are discharged, as it is imperative that you return to your primary care physician (or establish a relationship with a primary care physician if you do not have one) for your  aftercare needs so that they can reassess your need for medications and monitor your lab values.   Increase activity slowly   Complete by: As directed    Leave dressing on - Keep it clean, dry, and intact until clinic visit   Complete by: As directed       Allergies as of 12/06/2020   No Known Allergies      Medication List     STOP taking these medications    losartan 100 MG tablet Commonly known as: COZAAR   magnesium oxide 400 MG tablet Commonly known as: MAG-OX   spironolactone 25 MG tablet Commonly known as: ALDACTONE       TAKE these medications    diltiazem 240 MG 24 hr capsule Commonly known as: CARDIZEM CD Take 240 mg by mouth in the morning.   diphenhydrAMINE 25 MG tablet Commonly known as: BENADRYL Take 25 mg by mouth at bedtime.   docusate sodium 100 MG capsule Commonly known as: COLACE Take 100 mg by mouth 2 (two) times daily.   Fiber Therapy 500 MG Tabs Generic drug: Methylcellulose (Laxative) Take 500 mg by mouth in the morning and at bedtime.   gabapentin 300 MG capsule Commonly known as: NEURONTIN Take 300-600 mg by mouth See admin instructions. Take 1 capsule (300 mg) by mouth twice daily & take 2 capsules (600 mg) by mouth at bedtime   hydrALAZINE 50 MG tablet Commonly known as: APRESOLINE Take 1 tablet (50 mg total) by mouth 3 (three) times daily. What changed:  when to take this additional instructions   levothyroxine 100 MCG tablet Commonly known as: SYNTHROID Take 100 mcg by mouth daily before breakfast.   LORazepam 1 MG tablet Commonly known as: ATIVAN Take 0.5 mg by mouth at bedtime.   methocarbamol 500 MG tablet Commonly known as: ROBAXIN Take 1 tablet (500 mg total) by mouth every 6 (six) hours as needed for muscle spasms.   omeprazole 20 MG capsule Commonly known as: PRILOSEC Take 20 mg by mouth daily as needed (indigestion/heartburn).   oxyCODONE 5 MG immediate release tablet Commonly known as: Oxy  IR/ROXICODONE Take 1-2 tablets (5-10 mg total) by mouth every 6 (six) hours as needed for moderate pain or severe pain. Not to exceed 6 tablets a day.   psyllium 58.6 % packet Commonly known as: METAMUCIL Take 1 packet by mouth in the morning and at bedtime. Before breakfast & at bedtime   rivaroxaban 10 MG Tabs tablet Commonly known as: XARELTO Take 1 tablet (10 mg total) by mouth daily for 20 days. Then resume one 81 mg aspirin once a day.   sotalol 80 MG tablet Commonly known as: BETAPACE Take 80 mg by mouth 2 (two) times daily.               Discharge Care Instructions  (From admission, onward)           Start     Ordered   12/06/20 0000  Leave dressing on - Keep it clean, dry, and intact until clinic visit        12/06/20 0945            Follow-up Information     Hope Pigeon B, DO Follow up.   Specialty: Internal Medicine Contact information: [address] [place] [address] [phone]         Ollen Gross, MD Follow up.   Specialty: Orthopedic Surgery Contact information: [address] [place] [address] [place] [address] [phone]                No Known Allergies  Consultations: None    Procedures/Studies: CT ABDOMEN PELVIS W CONTRAST  Result Date: 12/03/2020 CLINICAL DATA:  Abdominal pain, nausea vomiting and constipation EXAM: CT ABDOMEN AND PELVIS WITH CONTRAST TECHNIQUE: Multidetector CT imaging of the abdomen and pelvis was performed using the standard protocol following bolus administration of intravenous contrast. CONTRAST:  6mL OMNIPAQUE IOHEXOL 300 MG/ML  SOLN COMPARISON:  None. FINDINGS: Lower chest: No acute abnormality. Hepatobiliary: Hypodense lesions in the left hepatic lobe measuring up to 1.8 x 1.5 cm, incompletely characterized but statistically likely to represent benign cysts or hemangiomas. Gallbladder is unremarkable. No biliary ductal dilation. Pancreas: Within normal limits.  Spleen: Within normal limits. Adrenals/Urinary Tract: Adrenal glands are unremarkable. Kidneys are normal, without renal calculi, solid enhancing lesion, or hydronephrosis. There is gas in a distended urinary bladder, recommend correlation with recent history of instrumentation. Stomach/Bowel: Small hiatal hernia otherwise the stomach is unremarkable for degree of distension. Normal positioning of the duodenum/ligament of Treitz. Fecalized loops of prominent distal small bowel without pathologic dilation of small bowel. The appendix and terminal ileum appear normal. Colonic diverticulosis without findings of acute diverticulitis. Sigmoid colonic anastomotic sutures. Moderate volume of stool in the colon. Vascular/Lymphatic: Aorta bi-iliac atherosclerotic calcifications without abdominal aortic aneurysm. No pathologically enlarged abdominal or pelvic lymph nodes. 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 No suspicious adnexal mass. Other: No abdominopelvic ascites. Musculoskeletal: Severe multilevel degenerative changes spine with multifocal neural foraminal and canal narrowing in the lower lumbar spine. Levoconvex curvature of the lumbar spine. Right total hip arthroplasty. Degenerative change of the left hip. No acute osseous abnormality. IMPRESSION: 1. Moderate volume of formed stool throughout the colon with fecalized loops of prominent distal small bowel without pathologic dilation. Findings may reflect slow transit/constipation. 2. Colonic diverticulosis without findings of acute diverticulitis. 3. Severe multilevel degenerative changes spine with multifocal neural foraminal and spinal canal narrowing in the lower lumbar spine. 4. Gas in a distended urinary bladder, recommend correlation with recent history of instrumentation. 5. Hypodense lesions in the left hepatic lobe measuring up to 1.8 x 1.5 cm, incompletely characterized but statistically likely to represent benign cysts  or hemangiomas in the absence of known malignancy. Consider more definitive characterization with nonemergent outpatient hepatic protocol MRI with and without contrast if clinically indicated. 6.  Aortic Atherosclerosis (ICD10-I70.0). Electronically Signed   By: Maudry Mayhew MD   On: 12/03/2020 16:49       Discharge Exam: Vitals:   12/05/20 2155 12/06/20 0409  BP: 133/85 136/88  Pulse: 92 75  Resp: 18 18  Temp: 97.8 F (36.6 C) 98.1 F (36.7 C)  SpO2: 97% 98%    General: Pt is alert, awake, not in acute distress Cardiovascular: RRR, S1/S2 +, no edema Respiratory: CTA bilaterally, no wheezing, no rhonchi, no respiratory distress, no conversational dyspnea  Abdominal: Soft, NT, ND, bowel sounds + Extremities: no edema, no cyanosis Psych: Normal mood and affect, stable judgement and insight     The results of significant diagnostics from this hospitalization (including imaging, microbiology, ancillary and laboratory) are listed below for reference.     Microbiology: Recent Results (from the past 240 hour(s))  Urine Culture     Status:  Abnormal   Collection Time: 12/03/20  5:25 PM   Specimen: Urine, Clean Catch  Result Value Ref Range Status   Specimen Description   Final    URINE, CLEAN CATCH Performed at [facility] [place], [address] [place], [place] [address]    Special Requests   Final    NONE Performed at [facility] [place], [address] [place], [place] [address]    Culture 20,000 COLONIES/mL ESCHERICHIA COLI (A)  Final   Report Status 12/06/2020 FINAL  Final   Organism ID, Bacteria ESCHERICHIA COLI (A)  Final      Susceptibility   Escherichia coli - MIC*    AMPICILLIN >=32 RESISTANT Resistant     CEFAZOLIN <=4 SENSITIVE Sensitive     CEFEPIME <=0.12 SENSITIVE Sensitive     CEFTRIAXONE <=0.25 SENSITIVE Sensitive     CIPROFLOXACIN <=0.25 SENSITIVE Sensitive     GENTAMICIN >=16 RESISTANT Resistant     IMIPENEM <=0.25 SENSITIVE Sensitive      NITROFURANTOIN <=16 SENSITIVE Sensitive     TRIMETH/SULFA >=320 RESISTANT Resistant     AMPICILLIN/SULBACTAM 4 SENSITIVE Sensitive     PIP/TAZO <=4 SENSITIVE Sensitive     * 20,000 COLONIES/mL ESCHERICHIA COLI  Resp Panel by RT-PCR (Flu A&B, Covid) Nasopharyngeal Swab     Status: None   Collection Time: 12/03/20  8:22 PM   Specimen: Nasopharyngeal Swab; Nasopharyngeal(NP) swabs in vial transport medium  Result Value Ref Range Status   SARS Coronavirus 2 by RT PCR NEGATIVE NEGATIVE Final    Comment: (NOTE) SARS-CoV-2 target nucleic acids are NOT DETECTED.  The SARS-CoV-2 RNA is generally detectable in upper respiratory specimens during the acute phase of infection. The lowest concentration of SARS-CoV-2 viral copies this assay can detect is 138 copies/mL. A negative result does not preclude SARS-Cov-2 infection and should not be used as the sole basis for treatment or other patient management decisions. A negative result may occur with  improper specimen collection/handling, submission of specimen other than nasopharyngeal swab, presence of viral mutation(s) within the areas targeted by this assay, and inadequate number of viral copies(<138 copies/mL). A negative result must be combined with clinical observations, patient history, and epidemiological information. The expected result is Negative.  Fact Sheet for Patients:  BloggerCourse.comhttps://www.fda.gov/media/152166/download  Fact Sheet for Healthcare Providers:  SeriousBroker.ithttps://www.fda.gov/media/152162/download  This test is no t yet approved or cleared by the [place] FDA and  has been authorized for detection and/or diagnosis of SARS-CoV-2 by FDA under an Emergency Use Authorization (EUA). This EUA will remain  in effect (meaning this test can be used) for the duration of the COVID-19 declaration under Section 564(b)(1) of the Act, 21 U.S.C.section 360bbb-3(b)(1), unless the authorization is terminated  or revoked sooner.       Influenza  A by PCR NEGATIVE NEGATIVE Final   Influenza B by PCR NEGATIVE NEGATIVE Final    Comment: (NOTE) The Xpert Xpress SARS-CoV-2/FLU/RSV plus assay is intended as an aid in the diagnosis of influenza from Nasopharyngeal swab specimens and should not be used as a sole basis for treatment. Nasal washings and aspirates are unacceptable for Xpert Xpress SARS-CoV-2/FLU/RSV testing.  Fact Sheet for Patients: BloggerCourse.comhttps://www.fda.gov/media/152166/download  Fact Sheet for Healthcare Providers: SeriousBroker.ithttps://www.fda.gov/media/152162/download  This test is not yet approved or cleared by the [place] FDA and has been authorized for detection and/or diagnosis of SARS-CoV-2 by FDA under an Emergency Use Authorization (EUA). This EUA will remain in effect (meaning this test can be used) for  the duration of the COVID-19 declaration under Section 564(b)(1) of the Act, 21 U.S.C. section 360bbb-3(b)(1), unless the authorization is terminated or revoked.  Performed at [facility], [address] [place], [place] [address]      Labs: BNP (last 3 results) No results for input(s): BNP in the last 8760 hours. Basic Metabolic Panel: Recent Labs  Lab 12/03/20 1823 12/04/20 0351 12/04/20 1024 12/05/20 0345 12/05/20 0714 12/06/20 0313  NA 125* 128* 127* 125*  --  128*  K 3.0* 3.5 3.5 3.1*  --  4.4  CL 90* 92* 92* 91*  --  94*  CO2 --  24  GLUCOSE 156* 144* 238* 116*  --  129*  BUN --  15  CREATININE 0.47 0.63 0.69 0.60  --  0.60  CALCIUM 7.1* 7.6* 7.4* 7.2*  --  7.5*  MG  --   --   --   --  1.9  --    Liver Function Tests: Recent Labs  Lab 12/03/20 1303  AST 24  ALT 22  ALKPHOS 96  BILITOT 0.8  PROT 7.2  ALBUMIN 3.8   Recent Labs  Lab 12/03/20 1303  LIPASE 37   No results for input(s): AMMONIA in the last 168 hours. CBC: Recent Labs  Lab 12/01/20 0326 12/03/20 1303 12/04/20 0351  WBC 10.0 11.6* 9.7  NEUTROABS  --   --  7.2  HGB 11.9* 12.8  12.6  HCT 34.2* 35.1* 36.3  MCV 99.7 95.1 97.6  PLT 245 301 307   Cardiac Enzymes: No results for input(s): CKTOTAL, CKMB, CKMBINDEX, TROPONINI in the last 168 hours. BNP: Invalid input(s): POCBNP CBG: Recent Labs  Lab 12/05/20 0755 12/05/20 1120 12/05/20 1557 12/05/20 2157 12/06/20 0750  GLUCAP 146* 152* 136* 216* 132*   D-Dimer No results for input(s): DDIMER in the last 72 hours. Hgb A1c No results for input(s): HGBA1C in the last 72 hours. Lipid Profile No results for input(s): CHOL, HDL, LDLCALC, TRIG, CHOLHDL, LDLDIRECT in the last 72 hours. Thyroid function studies Recent Labs    12/04/20 0351  TSH 0.941   Anemia work up No results for input(s): VITAMINB12, FOLATE, FERRITIN, TIBC, IRON, RETICCTPCT in the last 72 hours. Urinalysis    Component Value Date/Time   COLORURINE YELLOW 12/03/2020 1303   APPEARANCEUR HAZY (A) 12/03/2020 1303   LABSPEC 1.010 12/03/2020 1303   PHURINE 7.5 12/03/2020 1303   GLUCOSEU NEGATIVE 12/03/2020 1303   HGBUR TRACE (A) 12/03/2020 1303   BILIRUBINUR NEGATIVE 12/03/2020 1303   KETONESUR NEGATIVE 12/03/2020 1303   PROTEINUR NEGATIVE 12/03/2020 1303   NITRITE NEGATIVE 12/03/2020 1303   LEUKOCYTESUR SMALL (A) 12/03/2020 1303   Sepsis Labs Invalid input(s): PROCALCITONIN,  WBC,  LACTICIDVEN Microbiology Recent Results (from the past 240 hour(s))  Urine Culture     Status: Abnormal   Collection Time: 12/03/20  5:25 PM   Specimen: Urine, Clean Catch  Result Value Ref Range Status   Specimen Description   Final    URINE, CLEAN CATCH Performed at [facility], [address] [place], [place] [address]    Special Requests   Final    NONE Performed at [facility] [address] [place], [place] [address]    Culture 20,000 COLONIES/mL ESCHERICHIA COLI (A)  Final   Report Status 12/06/2020 FINAL  Final   Organism ID, Bacteria ESCHERICHIA COLI (A)  Final  Susceptibility   Escherichia coli - MIC*     AMPICILLIN >=32 RESISTANT Resistant     CEFAZOLIN <=4 SENSITIVE Sensitive     CEFEPIME <=0.12 SENSITIVE Sensitive     CEFTRIAXONE <=0.25 SENSITIVE Sensitive     CIPROFLOXACIN <=0.25 SENSITIVE Sensitive     GENTAMICIN >=16 RESISTANT Resistant     IMIPENEM <=0.25 SENSITIVE Sensitive     NITROFURANTOIN <=16 SENSITIVE Sensitive     TRIMETH/SULFA >=320 RESISTANT Resistant     AMPICILLIN/SULBACTAM 4 SENSITIVE Sensitive     PIP/TAZO <=4 SENSITIVE Sensitive     * 20,000 COLONIES/mL ESCHERICHIA COLI  Resp Panel by RT-PCR (Flu A&B, Covid) Nasopharyngeal Swab     Status: None   Collection Time: 12/03/20  8:22 PM   Specimen: Nasopharyngeal Swab; Nasopharyngeal(NP) swabs in vial transport medium  Result Value Ref Range Status   SARS Coronavirus 2 by RT PCR NEGATIVE NEGATIVE Final    Comment: (NOTE) SARS-CoV-2 target nucleic acids are NOT DETECTED.  The SARS-CoV-2 RNA is generally detectable in upper respiratory specimens during the acute phase of infection. The lowest concentration of SARS-CoV-2 viral copies this assay can detect is 138 copies/mL. A negative result does not preclude SARS-Cov-2 infection and should not be used as the sole basis for treatment or other patient management decisions. A negative result may occur with  improper specimen collection/handling, submission of specimen other than nasopharyngeal swab, presence of viral mutation(s) within the areas targeted by this assay, and inadequate number of viral copies(<138 copies/mL). A negative result must be combined with clinical observations, patient history, and epidemiological information. The expected result is Negative.  Fact Sheet for Patients:  BloggerCourse.com  Fact Sheet for Healthcare Providers:  SeriousBroker.it  This test is no t yet approved or cleared by the [place] FDA and  has been authorized for detection and/or diagnosis of SARS-CoV-2 by FDA under an  Emergency Use Authorization (EUA). This EUA will remain  in effect (meaning this test can be used) for the duration of the COVID-19 declaration under Section 564(b)(1) of the Act, 21 U.S.C.section 360bbb-3(b)(1), unless the authorization is terminated  or revoked sooner.       Influenza A by PCR NEGATIVE NEGATIVE Final   Influenza B by PCR NEGATIVE NEGATIVE Final    Comment: (NOTE) The Xpert Xpress SARS-CoV-2/FLU/RSV plus assay is intended as an aid in the diagnosis of influenza from Nasopharyngeal swab specimens and should not be used as a sole basis for treatment. Nasal washings and aspirates are unacceptable for Xpert Xpress SARS-CoV-2/FLU/RSV testing.  Fact Sheet for Patients: BloggerCourse.com  Fact Sheet for Healthcare Providers: SeriousBroker.it  This test is not yet approved or cleared by the [place] FDA and has been authorized for detection and/or diagnosis of SARS-CoV-2 by FDA under an Emergency Use Authorization (EUA). This EUA will remain in effect (meaning this test can be used) for the duration of the COVID-19 declaration under Section 564(b)(1) of the Act, 21 U.S.C. section 360bbb-3(b)(1), unless the authorization is terminated or revoked.  Performed at [facility], [address] [place], [place] [address]      Patient was seen and examined on the day of discharge and was found to be in stable condition. Time coordinating discharge: 25 minutes including assessment and coordination of care, as well as examination of the patient.   SIGNED:  Noralee Stain, DO [facility] 12/06/2020, 9:45 AM

## 2020-12-07 NOTE — Discharge Summary (Signed)
Physician Discharge Summary   Patient ID: Kathryn [name] MRN: [id number] DOB/AGE: 1940-09-28 80 y.o.  Admit date: 11/30/2020 Discharge date: 12/01/2020  Primary Diagnosis: Osteoarthritis, right knee   Admission Diagnoses:  Past Medical History:  Diagnosis Date   Arthritis    Dysrhythmia    a fib   Family history of adverse reaction to anesthesia    mother has problems with N/V    Hypertension    Hypothyroidism    PONV (postoperative nausea and vomiting)    Pre-diabetes    HGBa1c high   Discharge Diagnoses:   Principal Problem:   OA (osteoarthritis) of knee Active Problems:   Primary osteoarthritis of right knee  Estimated body mass index is 23.24 kg/m as calculated from the following:   Height as of this encounter: 5\' 1"  (1.549 m).   Weight as of this encounter: 55.8 kg.  Procedure:  Procedure(s) (LRB): TOTAL KNEE ARTHROPLASTY (Right)   Consults: None  HPI: Kathryn [name] is a 80 y.o. year old female with end stage OA of her right knee with progressively worsening pain and dysfunction. She has constant pain, with activity and at rest and significant functional deficits with difficulties even with ADLs. She has had extensive non-op management including analgesics, injections of cortisone and viscosupplements, and home exercise program, but remains in significant pain with significant dysfunction.Radiographs show bone on bone arthritis medial and patellofemoral. She presents now for right Total Knee Arthroplasty.   Laboratory Data: Admission on 11/30/2020, Discharged on 12/01/2020  Component Date Value Ref Range Status   WBC 12/01/2020 10.0  4.0 - 10.5 K/uL Final   RBC 12/01/2020 3.43 (A) 3.87 - 5.11 MIL/uL Final   Hemoglobin 12/01/2020 11.9 (A) 12.0 - 15.0 g/dL Final   HCT 01/31/2021 34.2 (A) 36.0 - 46.0 % Final   MCV 12/01/2020 99.7  80.0 - 100.0 fL Final   MCH 12/01/2020 34.7 (A) 26.0 - 34.0 pg Final   MCHC 12/01/2020 34.8  30.0 - 36.0 g/dL Final   RDW 01/31/2021 11.3  (A) 11.5 - 15.5 % Final   Platelets 12/01/2020 245  150 - 400 K/uL Final   nRBC 12/01/2020 0.0  0.0 - 0.2 % Final   Performed at [facility], [address] [place], [place]   Sodium 12/01/2020 134 (A) 135 - 145 mmol/L Final   Potassium 12/01/2020 4.3  3.5 - 5.1 mmol/L Final   Chloride 12/01/2020 97 (A) 98 - 111 mmol/L Final   CO2 12/01/2020 27  22 - 32 mmol/L Final   Glucose, Bld 12/01/2020 153 (A) 70 - 99 mg/dL Final   Glucose reference range applies only to samples taken after fasting for at least 8 hours.   BUN 12/01/2020 11  8 - 23 mg/dL Final   Creatinine, Ser 12/01/2020 0.69  0.44 - 1.00 mg/dL Final   Calcium 01/31/2021 7.9 (A) 8.9 - 10.3 mg/dL Final   GFR, Estimated 12/01/2020 >60  >60 mL/min Final   Comment: (NOTE) Calculated using the CKD-EPI Creatinine Equation (2021)    Anion gap 12/01/2020 10  5 - 15 Final   Performed at [facility], [address], [place], [place]  Orders Only on 11/26/2020  Component Date Value Ref Range Status   SARS Coronavirus 2 11/26/2020 RESULT: NEGATIVE   Final   Comment: RESULT: NEGATIVESARS-CoV-2 INTERPRETATION:A NEGATIVE  test result means that SARS-CoV-2 RNA was not present in the specimen above the limit of detection of this test. This does not  preclude a possible SARS-CoV-2 infection and should not be used as the  sole basis for patient management decisions. Negative results must be combined with clinical observations, patient history, and epidemiological information. Optimum specimen types and timing for peak viral levels during infections caused by SARS-CoV-2  have not been determined. Collection of multiple specimens or types of specimens may be necessary to detect virus. Improper specimen collection and handling, sequence variability under primers/probes, or organism present below the limit of detection may  lead to false negative results. Positive and negative predictive values of  testing are highly dependent on prevalence. False negative test results are more likely when prevalence of disease is high.The expected result is NEGATIVE.Fact S                          heet for  Healthcare Providers: [employer] Sheet for Patients: https://poole-freeman.org/ Reference Range - Negative   Hospital Outpatient Visit on 11/18/2020  Component Date Value Ref Range Status   WBC 11/18/2020 7.3  4.0 - 10.5 K/uL Final   RBC 11/18/2020 4.10  3.87 - 5.11 MIL/uL Final   Hemoglobin 11/18/2020 14.0  12.0 - 15.0 g/dL Final   HCT 16/01/9603 40.7  36.0 - 46.0 % Final   MCV 11/18/2020 99.3  80.0 - 100.0 fL Final   MCH 11/18/2020 34.1 (A) 26.0 - 34.0 pg Final   MCHC 11/18/2020 34.4  30.0 - 36.0 g/dL Final   RDW 54/12/8117 11.1 (A) 11.5 - 15.5 % Final   Platelets 11/18/2020 285  150 - 400 K/uL Final   nRBC 11/18/2020 0.0  0.0 - 0.2 % Final   Performed at [facility], [address], [place], [place] [address]   Sodium 11/18/2020 130 (A) 135 - 145 mmol/L Final   Potassium 11/18/2020 4.6  3.5 - 5.1 mmol/L Final   Chloride 11/18/2020 94 (A) 98 - 111 mmol/L Final   CO2 11/18/2020 26  22 - 32 mmol/L Final   Glucose, Bld 11/18/2020 130 (A) 70 - 99 mg/dL Final   Glucose reference range applies only to samples taken after fasting for at least 8 hours.   BUN 11/18/2020 13  8 - 23 mg/dL Final   Creatinine, Ser 11/18/2020 0.69  0.44 - 1.00 mg/dL Final   Calcium 95/62/1308 8.8 (A) 8.9 - 10.3 mg/dL Final   Total Protein 65/78/4696 7.2  6.5 - 8.1 g/dL Final   Albumin 29/52/8413 4.3  3.5 - 5.0 g/dL Final   AST 24/40/1027 22  15 - 41 U/L Final   ALT 11/18/2020 19  0 - 44 U/L Final   Alkaline Phosphatase 11/18/2020 101  38 - 126 U/L Final   Total Bilirubin 11/18/2020 0.9  0.3 - 1.2 mg/dL Final   GFR, Estimated 11/18/2020 >60  >60 mL/min Final   Comment: (NOTE) Calculated using the CKD-EPI Creatinine Equation (2021)    Anion  gap 11/18/2020 10  5 - 15 Final   Performed at [facility], [address], [place], [place] [address]   MRSA, PCR 11/18/2020 NEGATIVE  NEGATIVE Final   Staphylococcus aureus 11/18/2020 NEGATIVE  NEGATIVE Final   Comment: (NOTE) The Xpert SA Assay (FDA approved for NASAL specimens in patients 25 years of age and older), is one component of a comprehensive surveillance program. It is not intended to diagnose infection nor to guide or monitor treatment. Performed at [facility], [address] [place], [place] [address]    Hgb  A1c MFr Bld 11/18/2020 6.6 (A) 4.8 - 5.6 % Final   Comment: (NOTE)         Prediabetes: 5.7 - 6.4         Diabetes: >6.4         Glycemic control for adults with diabetes: <7.0    Mean Plasma Glucose 11/18/2020 143  mg/dL Final   Comment: (NOTE) Performed At: [facility] [address] [place], [place] [id number] Jolene Schimke MD ZW:[id number]    Prothrombin Time 11/18/2020 13.1  11.4 - 15.2 seconds Final   INR 11/18/2020 1.0  0.8 - 1.2 Final   Comment: (NOTE) INR goal varies based on device and disease states. Performed at [facility], [address], [place], [place] [address]      X-Rays:CT ABDOMEN PELVIS W CONTRAST  Result Date: 12/03/2020 CLINICAL DATA:  Abdominal pain, nausea vomiting and constipation EXAM: CT ABDOMEN AND PELVIS WITH CONTRAST TECHNIQUE: Multidetector CT imaging of the abdomen and pelvis was performed using the standard protocol following bolus administration of intravenous contrast. CONTRAST:  51mL OMNIPAQUE IOHEXOL 300 MG/ML  SOLN COMPARISON:  None. FINDINGS: Lower chest: No acute abnormality. Hepatobiliary: Hypodense lesions in the left hepatic lobe measuring up to 1.8 x 1.5 cm, incompletely characterized but statistically likely to represent benign cysts or hemangiomas. Gallbladder is unremarkable. No biliary ductal dilation. Pancreas: Within normal limits. Spleen:  Within normal limits. Adrenals/Urinary Tract: Adrenal glands are unremarkable. Kidneys are normal, without renal calculi, solid enhancing lesion, or hydronephrosis. There is gas in a distended urinary bladder, recommend correlation with recent history of instrumentation. Stomach/Bowel: Small hiatal hernia otherwise the stomach is unremarkable for degree of distension. Normal positioning of the duodenum/ligament of Treitz. Fecalized loops of prominent distal small bowel without pathologic dilation of small bowel. The appendix and terminal ileum appear normal. Colonic diverticulosis without findings of acute diverticulitis. Sigmoid colonic anastomotic sutures. Moderate volume of stool in the colon. Vascular/Lymphatic: Aorta bi-iliac atherosclerotic calcifications without abdominal aortic aneurysm. No pathologically enlarged abdominal or pelvic lymph nodes. 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 No suspicious adnexal mass. Other: No abdominopelvic ascites. Musculoskeletal: Severe multilevel degenerative changes spine with multifocal neural foraminal and canal narrowing in the lower lumbar spine. Levoconvex curvature of the lumbar spine. Right total hip arthroplasty. Degenerative change of the left hip. No acute osseous abnormality. IMPRESSION: 1. Moderate volume of formed stool throughout the colon with fecalized loops of prominent distal small bowel without pathologic dilation. Findings may reflect slow transit/constipation. 2. Colonic diverticulosis without findings of acute diverticulitis. 3. Severe multilevel degenerative changes spine with multifocal neural foraminal and spinal canal narrowing in the lower lumbar spine. 4. Gas in a distended urinary bladder, recommend correlation with recent history of instrumentation. 5. Hypodense lesions in the left hepatic lobe measuring up to 1.8 x 1.5 cm, incompletely characterized but statistically likely to represent benign cysts or  hemangiomas in the absence of known malignancy. Consider more definitive characterization with nonemergent outpatient hepatic protocol MRI with and without contrast if clinically indicated. 6.  Aortic Atherosclerosis (ICD10-I70.0). Electronically Signed   By: Maudry Mayhew MD   On: 12/03/2020 16:49    EKG: Orders placed or performed during the hospital encounter of 06/23/15   EKG 12-Lead   EKG 12-Lead     Hospital Course: Kathryn [name] is a 80 y.o. who was admitted to [facility]. They were brought to the operating room on 11/30/2020 and underwent Procedure(s): TOTAL KNEE ARTHROPLASTY.  Patient tolerated the  procedure well and was later transferred to the recovery room and then to the orthopaedic floor for postoperative care. They were given PO and IV analgesics for pain control following their surgery. They were given 24 hours of postoperative antibiotics of  Anti-infectives (From admission, onward)    Start     Dose/Rate Route Frequency Ordered Stop   11/30/20 1500  ceFAZolin (ANCEF) IVPB 2g/100 mL premix        2 g 200 mL/hr over 30 Minutes Intravenous Every 6 hours 11/30/20 1431 11/30/20 2208   11/30/20 0715  ceFAZolin (ANCEF) IVPB 2g/100 mL premix        2 g 200 mL/hr over 30 Minutes Intravenous On call to O.R. 11/30/20 [id number] 11/30/20 [id number]      and started on DVT prophylaxis in the form of Xarelto.   PT and OT were ordered for total joint protocol. Discharge planning consulted to help with postop disposition and equipment needs.  Patient had a good night on the evening of surgery. They started to get up OOB with therapy on POD #0. Pt was seen during rounds and was ready to go home pending progress with therapy. She worked with therapy on POD #1 and was meeting her goals. Pt was discharged to home later that day in stable condition.  Diet: Cardiac diet Activity: WBAT Follow-up: in 2 weeks Disposition: Home with OPPT Discharged Condition: stable   Discharge Instructions      Call MD / Call 911   Complete by: As directed    If you experience chest pain or shortness of breath, CALL 911 and be transported to the hospital emergency room.  If you develope a fever above 101 F, pus (white drainage) or increased drainage or redness at the wound, or calf pain, call your surgeon's office.   Change dressing   Complete by: As directed    You may remove the bulky bandage (ACE wrap and gauze) two days after surgery. You will have an adhesive waterproof bandage underneath. Leave this in place until your first follow-up appointment.   Constipation Prevention   Complete by: As directed    Drink plenty of fluids.  Prune juice may be helpful.  You may use a stool softener, such as Colace (over the counter) 100 mg twice a day.  Use MiraLax (over the counter) for constipation as needed.   Diet - low sodium heart healthy   Complete by: As directed    Do not put a pillow under the knee. Place it under the heel.   Complete by: As directed    Driving restrictions   Complete by: As directed    No driving for two weeks   Post-operative opioid taper instructions:   Complete by: As directed    POST-OPERATIVE OPIOID TAPER INSTRUCTIONS: It is important to wean off of your opioid medication as soon as possible. If you do not need pain medication after your surgery it is ok to stop day one. Opioids include: Codeine, Hydrocodone(Norco, Vicodin), Oxycodone(Percocet, oxycontin) and hydromorphone amongst others.  Long term and even short term use of opiods can cause: Increased pain response Dependence Constipation Depression Respiratory depression And more.  Withdrawal symptoms can include Flu like symptoms Nausea, vomiting And more Techniques to manage these symptoms Hydrate well Eat regular healthy meals Stay active Use relaxation techniques(deep breathing, meditating, yoga) Do Not substitute Alcohol to help with tapering If you have been on opioids for less than two weeks and do  not have pain than it  is ok to stop all together.  Plan to wean off of opioids This plan should start within one week post op of your joint replacement. Maintain the same interval or time between taking each dose and first decrease the dose.  Cut the total daily intake of opioids by one tablet each day Next start to increase the time between doses. The last dose that should be eliminated is the evening dose.      TED hose   Complete by: As directed    Use stockings (TED hose) for three weeks on both leg(s).  You may remove them at night for sleeping.   Weight bearing as tolerated   Complete by: As directed       Allergies as of 12/01/2020   No Known Allergies      Medication List     STOP taking these medications    aspirin EC 81 MG tablet   Calcium 600+D3 Plus Minerals Tabs   PRESERVISION AREDS 2 PO   Red Yeast Rice 600 MG Caps   vitamin C 1000 MG tablet   Zinc 50 MG Tabs       TAKE these medications    diltiazem 240 MG 24 hr capsule Commonly known as: CARDIZEM CD Take 240 mg by mouth in the morning.   diphenhydrAMINE 25 MG tablet Commonly known as: BENADRYL Take 25 mg by mouth at bedtime.   docusate sodium 100 MG capsule Commonly known as: COLACE Take 100 mg by mouth 2 (two) times daily.   Fiber Therapy 500 MG Tabs Generic drug: Methylcellulose (Laxative) Take 500 mg by mouth in the morning and at bedtime.   gabapentin 300 MG capsule Commonly known as: NEURONTIN Take 300-600 mg by mouth See admin instructions. Take 1 capsule (300 mg) by mouth twice daily & take 2 capsules (600 mg) by mouth at bedtime   levothyroxine 100 MCG tablet Commonly known as: SYNTHROID Take 100 mcg by mouth daily before breakfast.   LORazepam 1 MG tablet Commonly known as: ATIVAN Take 0.5 mg by mouth at bedtime.   methocarbamol 500 MG tablet Commonly known as: ROBAXIN Take 1 tablet (500 mg total) by mouth every 6 (six) hours as needed for muscle spasms.   omeprazole  20 MG capsule Commonly known as: PRILOSEC Take 20 mg by mouth daily as needed (indigestion/heartburn).   oxyCODONE 5 MG immediate release tablet Commonly known as: Oxy IR/ROXICODONE Take 1-2 tablets (5-10 mg total) by mouth every 6 (six) hours as needed for moderate pain or severe pain. Not to exceed 6 tablets a day.   psyllium 58.6 % packet Commonly known as: METAMUCIL Take 1 packet by mouth in the morning and at bedtime. Before breakfast & at bedtime   rivaroxaban 10 MG Tabs tablet Commonly known as: XARELTO Take 1 tablet (10 mg total) by mouth daily for 20 days. Then resume one 81 mg aspirin once a day.   sotalol 80 MG tablet Commonly known as: BETAPACE Take 80 mg by mouth 2 (two) times daily.               Discharge Care Instructions  (From admission, onward)           Start     Ordered   12/01/20 0000  Weight bearing as tolerated        12/01/20 0752   12/01/20 0000  Change dressing       Comments: You may remove the bulky bandage (ACE wrap and gauze) two days after  surgery. You will have an adhesive waterproof bandage underneath. Leave this in place until your first follow-up appointment.   12/01/20 3570            Follow-up Information     Ollen Gross, MD Follow up.   Specialty: Orthopedic Surgery Why: You have an appointment scheduled on 12/15/20 at 1:00pm Contact information: [address] [place] [address] [place] [address] [phone]                 Signed: Arther Abbott, PA-C Orthopedic Surgery 12/07/2020, 8:06 AM

## 2021-12-22 IMAGING — CT CT ABD-PELV W/ CM
2 of 5 series · 14 of 46 positions shown, 16 images · IV contrast (omnipaque)
Comparison: None.

CLINICAL DATA: Abdominal pain, nausea vomiting and constipation

EXAM:
CT ABDOMEN AND PELVIS WITH CONTRAST
TECHNIQUE: Multidetector CT imaging of the abdomen and pelvis was performed
using the standard protocol following bolus administration of
intravenous contrast.
CONTRAST:  75mL OMNIPAQUE IOHEXOL 300 MG/ML  SOLN

[Series 3: axial st · axial · 0.85mm/px · z∈[+752,+1172]mm · 11 of 94 slices shown, 13 images]
[im 5/94  soft-tissue]
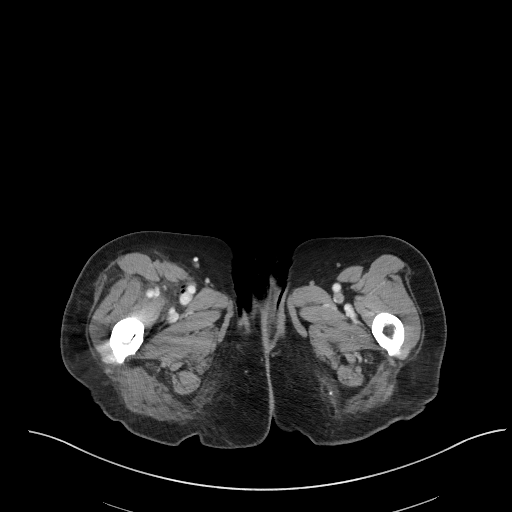
[im 5/94  bone]
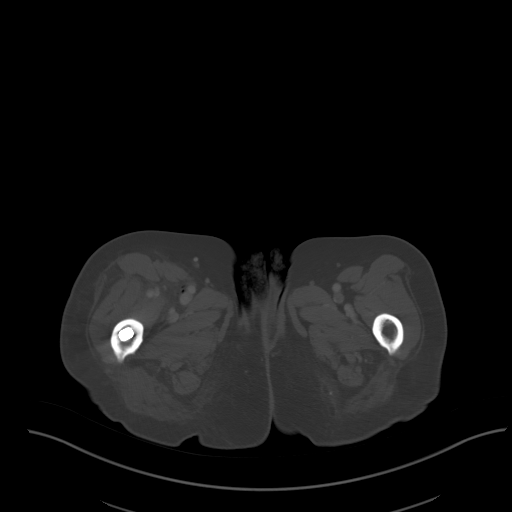
[im 14/94  soft-tissue]
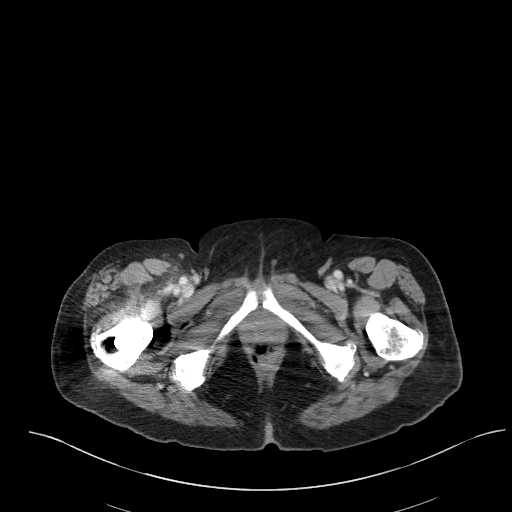
[im 24/94  soft-tissue]
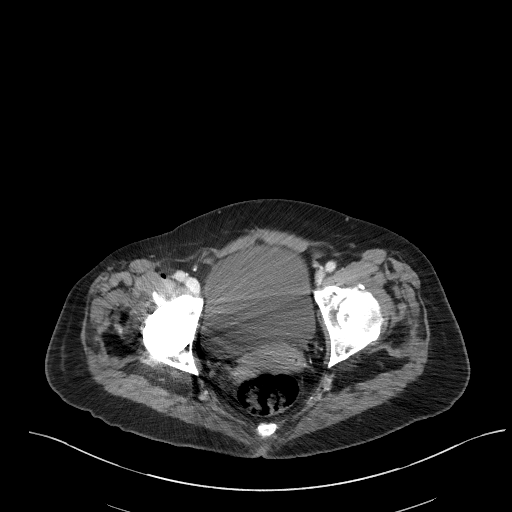
[im 33/94  soft-tissue]
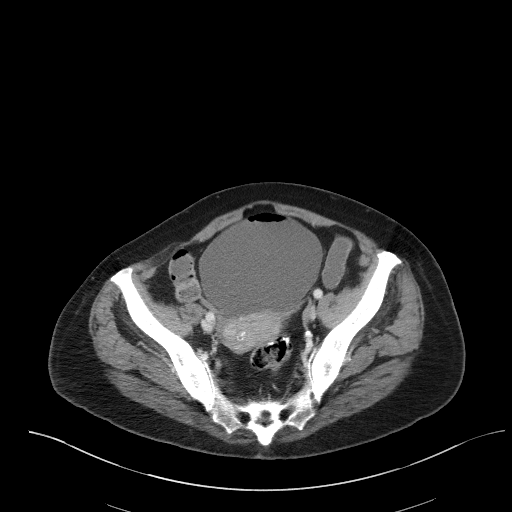
[im 38/94  soft-tissue]
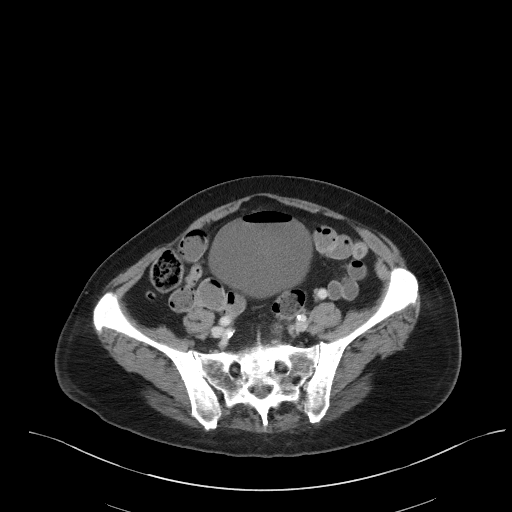
[im 47/94  soft-tissue]
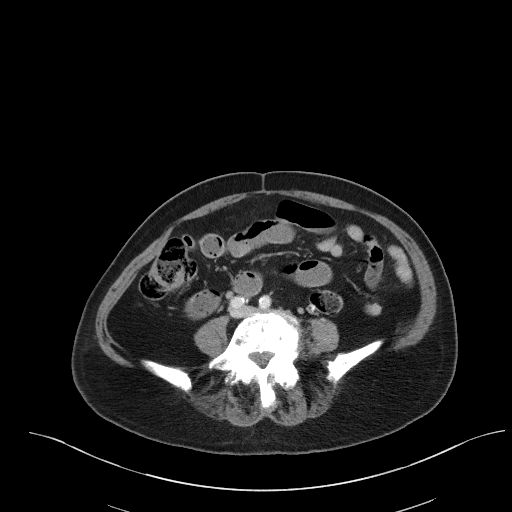
[im 56/94  soft-tissue]
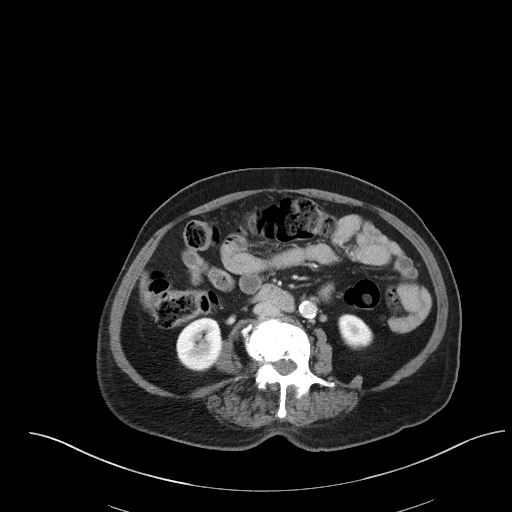
[im 61/94  soft-tissue]
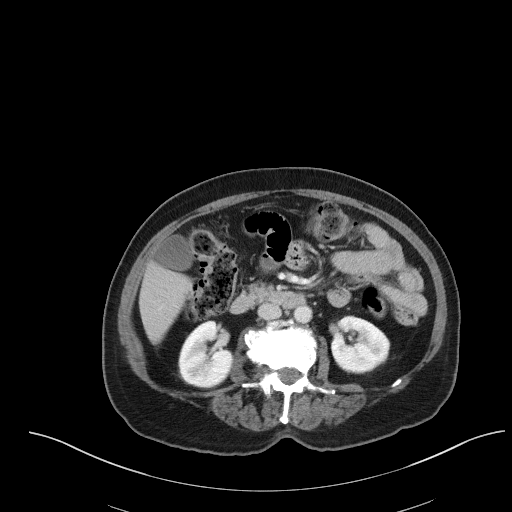
[im 70/94  soft-tissue]
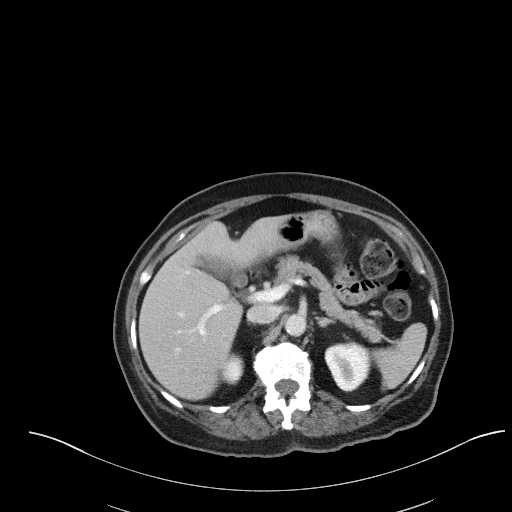
[im 70/94  bone]
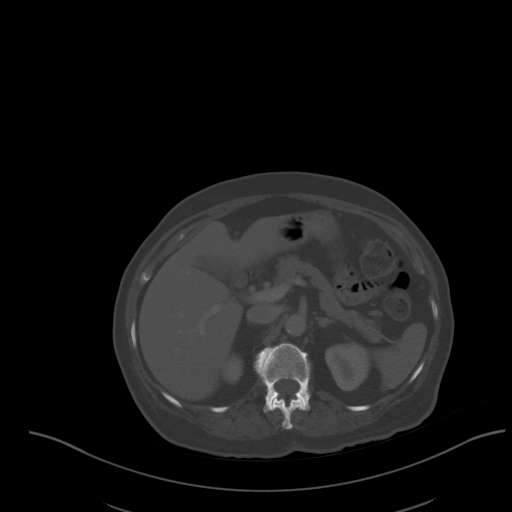
[im 80/94  soft-tissue]
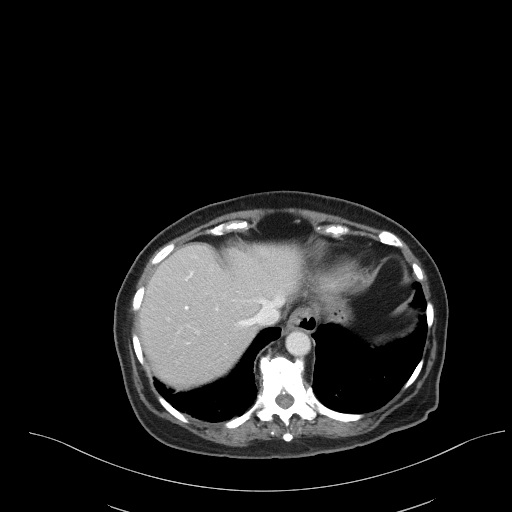
[im 89/94  soft-tissue]
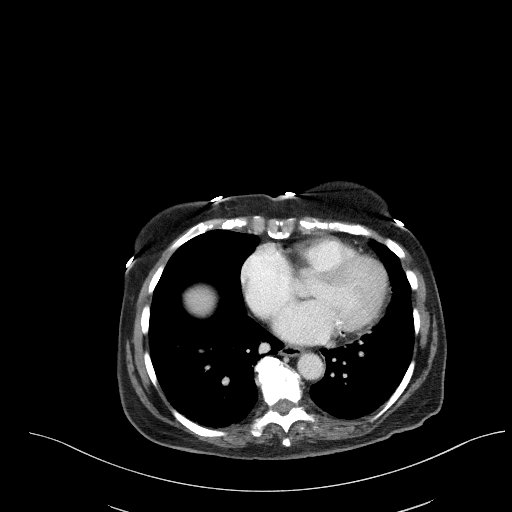

[Series 6: coronal st · coronal · 0.64mm/px · 3 of 101 slices shown]
[im 34/101  soft-tissue]
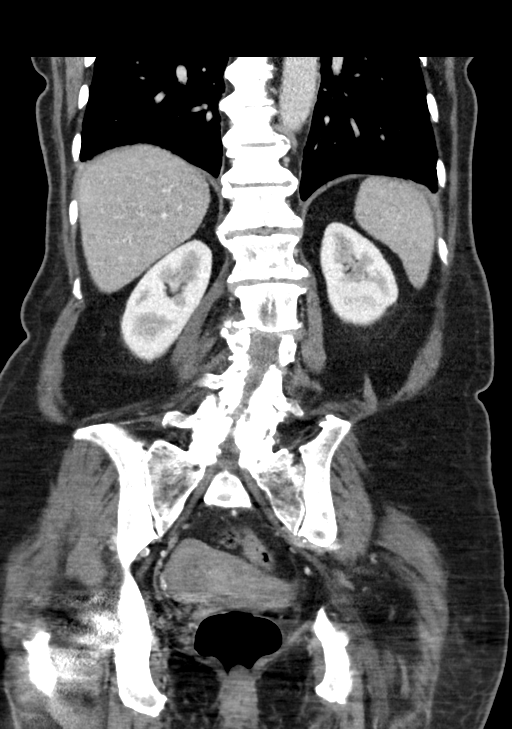
[im 45/101  soft-tissue]
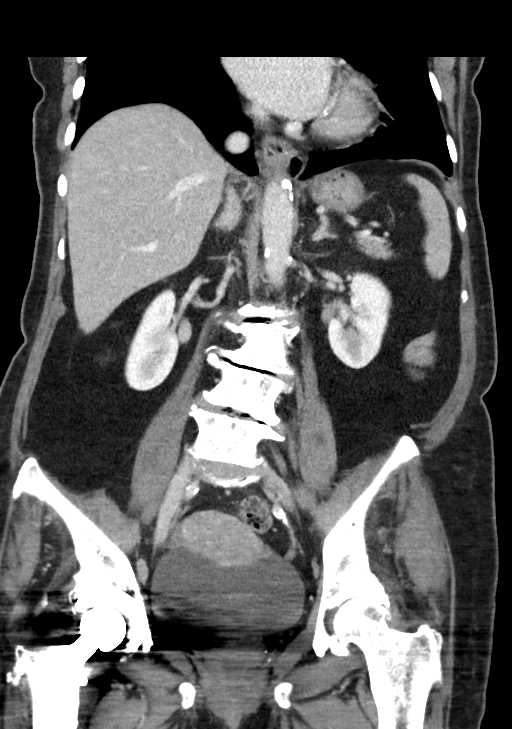
[im 56/101  soft-tissue]
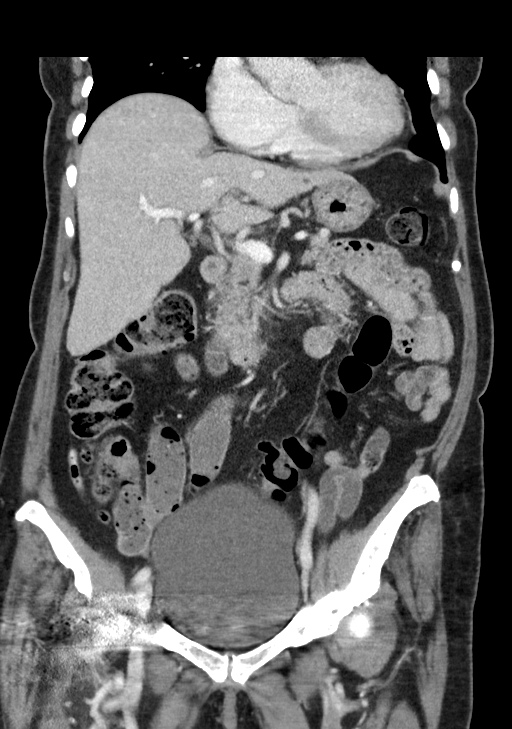

[14 of 46 positions shown; findings below may reference images not displayed]

FINDINGS: Lower chest: No acute abnormality.

Hepatobiliary: Hypodense lesions in the left hepatic lobe measuring
up to 1.8 x 1.5 cm, incompletely characterized but statistically
likely to represent benign cysts or hemangiomas. Gallbladder is
unremarkable. No biliary ductal dilation.

Pancreas: Within normal limits.

Spleen: Within normal limits.

Adrenals/Urinary Tract: Adrenal glands are unremarkable. Kidneys are
normal, without renal calculi, solid enhancing lesion, or
hydronephrosis. There is gas in a distended urinary bladder,
recommend correlation with recent history of instrumentation.

Stomach/Bowel: Small hiatal hernia otherwise the stomach is
unremarkable for degree of distension. Normal positioning of the
duodenum/ligament of Treitz. Fecalized loops of prominent distal
small bowel without pathologic dilation of small bowel. The appendix
and terminal ileum appear normal. Colonic diverticulosis without
findings of acute diverticulitis. Sigmoid colonic anastomotic
sutures. Moderate volume of stool in the colon.

Vascular/Lymphatic: Aorta bi-iliac atherosclerotic calcifications
without abdominal aortic aneurysm. No pathologically enlarged
abdominal or pelvic lymph nodes.

Reproductive: Lobular uterine contour likely reflecting uterine
leiomyomas. No suspicious adnexal mass.

Other: No abdominopelvic ascites.

Musculoskeletal: Severe multilevel degenerative changes spine with
multifocal neural foraminal and canal narrowing in the lower lumbar
spine. Levoconvex curvature of the lumbar spine. Right total hip
arthroplasty. Degenerative change of the left hip. No acute osseous
abnormality.
IMPRESSION: 1. Moderate volume of formed stool throughout the colon with
fecalized loops of prominent distal small bowel without pathologic
dilation. Findings may reflect slow transit/constipation.
2. Colonic diverticulosis without findings of acute diverticulitis.
3. Severe multilevel degenerative changes spine with multifocal
neural foraminal and spinal canal narrowing in the lower lumbar
spine.
4. Gas in a distended urinary bladder, recommend correlation with
recent history of instrumentation.
5. Hypodense lesions in the left hepatic lobe measuring up to 1.8 x
1.5 cm, incompletely characterized but statistically likely to
represent benign cysts or hemangiomas in the absence of known
malignancy. Consider more definitive characterization with
nonemergent outpatient hepatic protocol MRI with and without
contrast if clinically indicated.
6.  Aortic Atherosclerosis (0HR2G-1K2.2).

## 2023-07-05 ENCOUNTER — Other Ambulatory Visit: Payer: Self-pay | Admitting: Neurosurgery

## 2023-07-11 ENCOUNTER — Other Ambulatory Visit (HOSPITAL_COMMUNITY)

## 2023-07-20 ENCOUNTER — Encounter (HOSPITAL_COMMUNITY): Payer: Self-pay

## 2023-07-20 ENCOUNTER — Other Ambulatory Visit: Payer: Self-pay

## 2023-07-20 ENCOUNTER — Encounter (HOSPITAL_COMMUNITY)
Admission: RE | Admit: 2023-07-20 | Discharge: 2023-07-20 | Disposition: A | Discharge status: Home / Self Care | Source: Ambulatory Visit | Attending: Neurosurgery | Admitting: Neurosurgery

## 2023-07-20 VITALS — BP 178/78 | HR 74 | Temp 97.9°F | Resp 17 | Ht 62.0 in | Wt 118.6 lb

## 2023-07-20 DIAGNOSIS — E039 Hypothyroidism, unspecified: Secondary | ICD-10-CM | POA: Insufficient documentation

## 2023-07-20 DIAGNOSIS — Z01818 Encounter for other preprocedural examination: Secondary | ICD-10-CM | POA: Insufficient documentation

## 2023-07-20 DIAGNOSIS — I2510 Atherosclerotic heart disease of native coronary artery without angina pectoris: Secondary | ICD-10-CM | POA: Insufficient documentation

## 2023-07-20 DIAGNOSIS — E871 Hypo-osmolality and hyponatremia: Secondary | ICD-10-CM | POA: Insufficient documentation

## 2023-07-20 DIAGNOSIS — Z79899 Other long term (current) drug therapy: Secondary | ICD-10-CM | POA: Insufficient documentation

## 2023-07-20 DIAGNOSIS — I10 Essential (primary) hypertension: Secondary | ICD-10-CM | POA: Diagnosis not present

## 2023-07-20 DIAGNOSIS — I440 Atrioventricular block, first degree: Secondary | ICD-10-CM | POA: Insufficient documentation

## 2023-07-20 DIAGNOSIS — I480 Paroxysmal atrial fibrillation: Secondary | ICD-10-CM | POA: Insufficient documentation

## 2023-07-20 HISTORY — DX: Myoneural disorder, unspecified: G70.9

## 2023-07-20 LAB — BASIC METABOLIC PANEL WITH GFR
Anion gap: 10 (ref 5–15)
BUN: 12 mg/dL (ref 8–23)
CO2: 27 mmol/L (ref 22–32)
Calcium: 8.9 mg/dL (ref 8.9–10.3)
Chloride: 90 mmol/L — ABNORMAL LOW (ref 98–111)
Creatinine, Ser: 0.65 mg/dL (ref 0.44–1.00)
GFR, Estimated: >60 mL/min (ref >60)
Glucose, Bld: 129 mg/dL — ABNORMAL HIGH (ref 70–99)
Potassium: 4.4 mmol/L (ref 3.5–5.1)
Sodium: 127 mmol/L — ABNORMAL LOW (ref 135–145)

## 2023-07-20 LAB — CBC
HCT: 40.8 % (ref 36.0–46.0)
Hemoglobin: 14.5 g/dL (ref 12.0–15.0)
MCH: 34.4 pg — ABNORMAL HIGH (ref 26.0–34.0)
MCHC: 35.5 g/dL (ref 30.0–36.0)
MCV: 96.7 fL (ref 80.0–100.0)
Platelets: 419 10*3/uL — ABNORMAL HIGH (ref 150–400)
RBC: 4.22 MIL/uL (ref 3.87–5.11)
RDW: 10.8 % — ABNORMAL LOW (ref 11.5–15.5)
WBC: 11.6 10*3/uL — ABNORMAL HIGH (ref 4.0–10.5)
nRBC: 0 % (ref 0.0–0.2)

## 2023-07-20 LAB — SURGICAL PCR SCREEN
MRSA, PCR: NEGATIVE
Staphylococcus aureus: POSITIVE — AB

## 2023-07-20 NOTE — Pre-Procedure Instructions (Addendum)
 Surgical Instructions   Your procedure is scheduled on August 01, 2023. Report to [facility] Main Entrance "A" at 10:00 A.M., then check in with the Admitting office. Any questions or running late day of surgery: call [phone]  Questions prior to your surgery date: call [phone], Monday-Friday, 8am-4pm. If you experience any cold or flu symptoms such as cough, fever, chills, shortness of breath, etc. between now and your scheduled surgery, please notify us at the above number.     Remember:  Do not eat after midnight the night before your surgery  You may drink clear liquids until 9:00 AM the morning of your surgery.   Clear liquids allowed are: Water, Non-Citrus Juices (without pulp), Carbonated Beverages, Clear Tea (no milk, honey, etc.), Black Coffee Only (NO MILK, CREAM OR POWDERED CREAMER of any kind), and Gatorade.    Take these medicines the morning of surgery with A SIP OF WATER: diltiazem (CARDIZEM CD)  gabapentin (NEURONTIN)  hydrALAZINE (APRESOLINE)  levothyroxine (SYNTHROID)  omeprazole (PRILOSEC)  sotalol (BETAPACE)    May take these medicines IF NEEDED: docusate sodium (COLACE)  LORazepam (ATIVAN)  methocarbamol (ROBAXIN)  ondansetron (ZOFRAN-ODT)  oxyCODONE (OXY IR/ROXICODONE)    Follow your surgeon's instructions on when to stop Asprin.  If no instructions were given by your surgeon then you will need to call the office to get those instructions.     One week prior to surgery, STOP taking any Aleve, Naproxen, Ibuprofen, Motrin, Advil, Goody's, BC's, all herbal medications, fish oil, and non-prescription vitamins.                     Do NOT Smoke (Tobacco/Vaping) for 24 hours prior to your procedure.  If you use a CPAP at night, you may bring your mask/headgear for your overnight stay.   You will be asked to remove any contacts, glasses, piercing's, hearing aid's, dentures/partials prior to surgery. Please bring cases for these items if needed.     Patients discharged the day of surgery will not be allowed to drive home, and someone needs to stay with them for 24 hours.  SURGICAL WAITING ROOM VISITATION Patients may have no more than 2 support people in the waiting area - these visitors may rotate.   Pre-op nurse will coordinate an appropriate time for 1 ADULT support person, who may not rotate, to accompany patient in pre-op.  Children under the age of 33 must have an adult with them who is not the patient and must remain in the main waiting area with an adult.  If the patient needs to stay at the hospital during part of their recovery, the visitor guidelines for inpatient rooms apply.  Please refer to the [facility] website for the visitor guidelines for any additional information.   If you received a COVID test during your pre-op visit  it is requested that you wear a mask when out in public, stay away from anyone that may not be feeling well and notify your surgeon if you develop symptoms. If you have been in contact with anyone that has tested positive in the last 10 days please notify you surgeon.      Pre-operative 5 CHG Bathing Instructions   You can play a key role in reducing the risk of infection after surgery. Your skin needs to be as free of germs as possible. You can reduce the number of germs on your skin by washing with CHG (chlorhexidine gluconate) soap before surgery. CHG is an antiseptic soap  that kills germs and continues to kill germs even after washing.   DO NOT use if you have an allergy to chlorhexidine/CHG or antibacterial soaps. If your skin becomes reddened or irritated, stop using the CHG and notify one of our RNs at [phone].   Please shower with the CHG soap starting 4 days before surgery using the following schedule:     Please keep in mind the following:  DO NOT shave, including legs and underarms, starting the day of your first shower.   You may shave your face at any point before/day of  surgery.  Place clean sheets on your bed the day you start using CHG soap. Use a clean washcloth (not used since being washed) for each shower. DO NOT sleep with pets once you start using the CHG.   CHG Shower Instructions:  Wash your face and private area with normal soap. If you choose to wash your hair, wash first with your normal shampoo.  After you use shampoo/soap, rinse your hair and body thoroughly to remove shampoo/soap residue.  Turn the water OFF and apply about 3 tablespoons (45 ml) of CHG soap to a CLEAN washcloth.  Apply CHG soap ONLY FROM YOUR NECK DOWN TO YOUR TOES (washing for 3-5 minutes)  DO NOT use CHG soap on face, private areas, open wounds, or sores.  Pay special attention to the area where your surgery is being performed.  If you are having back surgery, having someone wash your back for you may be helpful. Wait 2 minutes after CHG soap is applied, then you may rinse off the CHG soap.  Pat dry with a clean towel  Put on clean clothes/pajamas   If you choose to wear lotion, please use ONLY the CHG-compatible lotions that are listed below.  Additional instructions for the day of surgery: DO NOT APPLY any lotions, deodorants, cologne, or perfumes.   Do not bring valuables to the hospital. [facility] is not responsible for any belongings/valuables. Do not wear nail polish, gel polish, artificial nails, or any other type of covering on natural nails (fingers and toes) Do not wear jewelry or makeup Put on clean/comfortable clothes.  Please brush your teeth.  Ask your nurse before applying any prescription medications to the skin.     CHG Compatible Lotions   Aveeno Moisturizing lotion  Cetaphil Moisturizing Cream  Cetaphil Moisturizing Lotion  Clairol Herbal Essence Moisturizing Lotion, Dry Skin  Clairol Herbal Essence Moisturizing Lotion, Extra Dry Skin  Clairol Herbal Essence Moisturizing Lotion, Normal Skin  Curel Age Defying Therapeutic Moisturizing Lotion  with Alpha Hydroxy  Curel Extreme Care Body Lotion  Curel Soothing Hands Moisturizing Hand Lotion  Curel Therapeutic Moisturizing Cream, Fragrance-Free  Curel Therapeutic Moisturizing Lotion, Fragrance-Free  Curel Therapeutic Moisturizing Lotion, Original Formula  Eucerin Daily Replenishing Lotion  Eucerin Dry Skin Therapy Plus Alpha Hydroxy Crme  Eucerin Dry Skin Therapy Plus Alpha Hydroxy Lotion  Eucerin Original Crme  Eucerin Original Lotion  Eucerin Plus Crme Eucerin Plus Lotion  Eucerin TriLipid Replenishing Lotion  Keri Anti-Bacterial Hand Lotion  Keri Deep Conditioning Original Lotion Dry Skin Formula Softly Scented  Keri Deep Conditioning Original Lotion, Fragrance Free Sensitive Skin Formula  Keri Lotion Fast Absorbing Fragrance Free Sensitive Skin Formula  Keri Lotion Fast Absorbing Softly Scented Dry Skin Formula  Keri Original Lotion  Keri Skin Renewal Lotion Keri Silky Smooth Lotion  Keri Silky Smooth Sensitive Skin Lotion  Nivea Body Creamy Conditioning Oil  Nivea Body Extra Enriched Lotion  [place]  Body Original Lotion  Nivea Body Sheer Moisturizing Lotion Nivea Crme  Nivea Skin Firming Lotion  NutraDerm 30 Skin Lotion  NutraDerm Skin Lotion  NutraDerm Therapeutic Skin Cream  NutraDerm Therapeutic Skin Lotion  ProShield Protective Hand Cream  Provon moisturizing lotion  Please read over the following fact sheets that you were given.

## 2023-07-20 NOTE — Progress Notes (Addendum)
 PCP - Dr. Louie Bun Cardiologist - Dr. Heide Scales - Last office visit 08/03/2022  PPM/ICD - Denies Device Orders - n/a Rep Notified - n/a   Chest x-ray - n/a EKG - 07/20/2023 Stress Test - Denies ECHO - 02/22/2017 (CE) Cardiac Cath - Denies  Sleep Study - Denies CPAP - n/a  Pt is Pre-DM  Last dose of GLP1 agonist-  n/a GLP1 instructions:  n/a  Blood Thinner Instructions: n/a Aspirin Instructions: Pt has already stopped her ASA. Last dose was March 22nd.  ERAS Protcol - Clear liquids until 0900 morning of surgery PRE-SURGERY Ensure or G2- n/a  COVID TEST- n/a   Anesthesia review: Yes. Hx of HTN and a. Fib with no anticoagulation (patient preference), EKG review and abnormal BMP result. Pt notes recent UTI diagnosis 07/04/23. She has tried three different antibiotics due to unwanted side effects, but never completed a full course. She denies any UTI symptoms today, but will attempt to have another UA completed to verify resolution of UTI.   Patient denies shortness of breath, fever, cough and chest pain at PAT appointment. Pt recently saw PCP for nasal congestion. PCP not concerned about respiratory illness and prescribed Zyrtec, which has resolved her nasal drainage. Pt instructed to call phone number on paperwork if she were to develop any additional fever, sore throat, productive cough.   All instructions explained to the patient and patients son Lorin Picket, with a verbal understanding of the material. Patient agrees to go over the instructions while at home for a better understanding. Patient also instructed to self quarantine after being tested for COVID-19. The opportunity to ask questions was provided.

## 2023-07-21 NOTE — Progress Notes (Signed)
 Anesthesia Chart Review:  83 year old female follows with cardiology at [facility] for history of HTN and paroxysmal atrial fibrillation which is noted to be well-controlled on low-dose sotalol.  She has declined anticoagulation.  She had an echo done at an outside hospital in 01/2017 which is not available for review, however, note from PCP 03/21/23 states, "transthoracic echocardiogram 2018 stable EF 55-60 percent with mild diastolic dysfunction." Last seen by cardiologist Dr. Beverely Pace on 08/03/2022, stable at that time.   History of difficult to control hypertension.  This is managed by both her PCP and nephrology.  She is currently on Cardizem 120 mg daily and hydralazine 50 mg daily.  BP was noted to be controlled when last seen by her PCP on 03/21/2023, 128/64 at that time.  Blood pressure was significantly elevated at preadmission testing, 178/78.  I did call the patient on 07/21/2023 to follow-up on this.  She states she tends to have whitecoat hypertension and she was in a significant amount of pain from her lower back as well.  She does check her blood pressure at home and states it generally runs around 140 over 70s.  She states compliance with her antihypertensive medications.  She was instructed to continue monitoring and to reach out to her PCP if it was consistently elevated.  Other pertinent history includes PONV, hypothyroid, chronic hyponatremia.  Preop labs reviewed, stable chronic hyponatremia sodium 127, labs otherwise unremarkable.  Sodium (mmol/L)  Date Value  07/20/2023 127 (L)  12/06/2020 128 (L)  12/05/2020 125 (L)  12/04/2020 127 (L)  12/04/2020 128 (L)    EKG 07/20/2023: Sinus rhythm with 1st degree A-V block rate 71. Left axis deviation. Septal infarct , age undetermined. ST & T wave abnormality, consider lateral ischemia.  No significant change.  Inferolateral ST and T wave abnormalities have been noted since at least 02/10/2017.     Zannie Cove [facility] Short Stay  Center/Anesthesiology Phone [phone] 07/21/2023 3:03 PM

## 2023-07-21 NOTE — Anesthesia Preprocedure Evaluation (Addendum)
 Anesthesia Evaluation  Patient identified by MRN, date of birth, ID band Patient awake    Reviewed: Allergy & Precautions, NPO status , Patient's Chart, lab work & pertinent test results, reviewed documented beta blocker date and time   History of Anesthesia Complications (+) PONV  Airway Mallampati: II  TM Distance: >3 FB Neck ROM: Full    Dental  (+) Dental Advisory Given   Pulmonary neg pulmonary ROS   breath sounds clear to auscultation       Cardiovascular hypertension, Pt. on medications and Pt. on home beta blockers (-) angina + dysrhythmias Atrial Fibrillation  Rhythm:Regular Rate:Normal  '18 TTE: EF 55-60%, mild diastolic dysfunction, mild MR, mild TR   Neuro/Psych Back pain    GI/Hepatic Neg liver ROS,GERD  Medicated and Controlled,,  Endo/Other  Hypothyroidism    Renal/GU negative Renal ROS     Musculoskeletal   Abdominal   Peds  Hematology Hb 14.5, plt 419k   Anesthesia Other Findings   Reproductive/Obstetrics                             Anesthesia Physical Anesthesia Plan  ASA: 3  Anesthesia Plan: General   Post-op Pain Management: Tylenol PO (pre-op)*   Induction: Intravenous  PONV Risk Score and Plan: 4 or greater and Ondansetron, Dexamethasone, Treatment may vary due to age or medical condition and Propofol infusion  Airway Management Planned: Oral ETT  Additional Equipment: None  Intra-op Plan:   Post-operative Plan: Extubation in OR  Informed Consent: I have reviewed the patients History and Physical, chart, labs and discussed the procedure including the risks, benefits and alternatives for the proposed anesthesia with the patient or authorized representative who has indicated his/her understanding and acceptance.     Dental advisory given  Plan Discussed with: CRNA and Surgeon  Anesthesia Plan Comments: (PAT note by Antionette Poles, PA-C: 83 year old  female follows with cardiology at [facility] for history of HTN and paroxysmal atrial fibrillation which is noted to be well-controlled on low-dose sotalol.  She has declined anticoagulation.  She had an echo done at an outside hospital in 01/2017 which is not available for review, however, note from PCP 03/21/23 states, "transthoracic echocardiogram 2018 stable EF 55-60 percent with mild diastolic dysfunction." Last seen by cardiologist Dr. Beverely Pace on 08/03/2022, stable at that time.   History of difficult to control hypertension.  This is managed by both her PCP and nephrology.  She is currently on Cardizem 120 mg daily and hydralazine 50 mg daily.  BP was noted to be controlled when last seen by her PCP on 03/21/2023, 128/64 at that time.  Blood pressure was significantly elevated at preadmission testing, 178/78.  I did call the patient on 07/21/2023 to follow-up on this.  She states she tends to have whitecoat hypertension and she was in a significant amount of pain from her lower back as well.  She does check her blood pressure at home and states it generally runs around 140 over 70s.  She states compliance with her antihypertensive medications.  She was instructed to continue monitoring and to reach out to her PCP if it was consistently elevated.  Other pertinent history includes PONV, hypothyroid, chronic hyponatremia.  Preop labs reviewed, stable chronic hyponatremia sodium 127, labs otherwise unremarkable.  Sodium (mmol/L) Date Value 07/20/2023 127 (L) 12/06/2020 128 (L) 12/05/2020 125 (L) 12/04/2020 127 (L) 12/04/2020 128 (L)   EKG 07/20/2023: Sinus rhythm with 1st degree A-V  block rate 71. Left axis deviation. Septal infarct , age undetermined. ST & T wave abnormality, consider lateral ischemia.  No significant change.  Inferolateral ST and T wave abnormalities have been noted since at least 02/10/2017.  )        Anesthesia Quick Evaluation

## 2023-08-01 ENCOUNTER — Encounter (HOSPITAL_COMMUNITY): Payer: Self-pay | Admitting: Neurosurgery

## 2023-08-01 ENCOUNTER — Observation Stay (HOSPITAL_COMMUNITY)
Admission: RE | Admit: 2023-08-01 | Discharge: 2023-08-02 | Disposition: A | Discharge status: Home / Self Care | Attending: Neurosurgery | Admitting: Neurosurgery

## 2023-08-01 ENCOUNTER — Other Ambulatory Visit: Payer: Self-pay

## 2023-08-01 ENCOUNTER — Ambulatory Visit (HOSPITAL_COMMUNITY): Payer: Self-pay | Admitting: Physician Assistant

## 2023-08-01 ENCOUNTER — Ambulatory Visit (HOSPITAL_COMMUNITY)

## 2023-08-01 ENCOUNTER — Ambulatory Visit (HOSPITAL_BASED_OUTPATIENT_CLINIC_OR_DEPARTMENT_OTHER): Payer: Self-pay | Admitting: Registered Nurse

## 2023-08-01 ENCOUNTER — Encounter (HOSPITAL_COMMUNITY)
Admission: RE | Disposition: A | Discharge status: Home / Self Care | Payer: Self-pay | Source: Home / Self Care | Attending: Neurosurgery

## 2023-08-01 DIAGNOSIS — M48062 Spinal stenosis, lumbar region with neurogenic claudication: Secondary | ICD-10-CM

## 2023-08-01 DIAGNOSIS — Z79899 Other long term (current) drug therapy: Secondary | ICD-10-CM | POA: Diagnosis not present

## 2023-08-01 DIAGNOSIS — Z96651 Presence of right artificial knee joint: Secondary | ICD-10-CM | POA: Insufficient documentation

## 2023-08-01 DIAGNOSIS — I4891 Unspecified atrial fibrillation: Secondary | ICD-10-CM | POA: Insufficient documentation

## 2023-08-01 DIAGNOSIS — I10 Essential (primary) hypertension: Secondary | ICD-10-CM | POA: Diagnosis not present

## 2023-08-01 DIAGNOSIS — E039 Hypothyroidism, unspecified: Secondary | ICD-10-CM | POA: Diagnosis not present

## 2023-08-01 DIAGNOSIS — Z96641 Presence of right artificial hip joint: Secondary | ICD-10-CM | POA: Insufficient documentation

## 2023-08-01 DIAGNOSIS — Z7982 Long term (current) use of aspirin: Secondary | ICD-10-CM | POA: Diagnosis not present

## 2023-08-01 HISTORY — PX: FORAMINOTOMY 1 LEVEL: SHX5835

## 2023-08-01 SURGERY — FORAMINOTOMY 1 LEVEL
Anesthesia: General | Site: Spine Lumbar

## 2023-08-01 MED ORDER — ROCURONIUM BROMIDE 10 MG/ML (PF) SYRINGE
PREFILLED_SYRINGE | INTRAVENOUS | Status: AC
Start: 2023-08-01 — End: ?
  Filled 2023-08-01: qty 10

## 2023-08-01 MED ORDER — BUPIVACAINE HCL (PF) 0.25 % IJ SOLN
INTRAMUSCULAR | Status: AC
  Filled 2023-08-01: qty 30

## 2023-08-01 MED ORDER — ROCURONIUM BROMIDE 10 MG/ML (PF) SYRINGE
PREFILLED_SYRINGE | INTRAVENOUS | Status: DC | PRN
  Administered 2023-08-01: 55 mg via INTRAVENOUS
  Administered 2023-08-01: 5 mg via INTRAVENOUS

## 2023-08-01 MED ORDER — VITAMIN C 500 MG PO TABS
1000.0000 mg | ORAL_TABLET | Freq: Every day | ORAL | Status: DC
  Administered 2023-08-01 – 2023-08-02 (×2): 1000 mg via ORAL
  Filled 2023-08-01 (×2): qty 2

## 2023-08-01 MED ORDER — PHENYLEPHRINE HCL-NACL 20-0.9 MG/250ML-% IV SOLN
INTRAVENOUS | Status: DC | PRN
  Administered 2023-08-01: 40 ug/min via INTRAVENOUS

## 2023-08-01 MED ORDER — HYDROCODONE-ACETAMINOPHEN 5-325 MG PO TABS
1.0000 | ORAL_TABLET | ORAL | Status: DC | PRN
  Filled 2023-08-01: qty 1

## 2023-08-01 MED ORDER — SOTALOL HCL 80 MG PO TABS
80.0000 mg | ORAL_TABLET | Freq: Two times a day (BID) | ORAL | Status: DC
  Administered 2023-08-01 – 2023-08-02 (×2): 80 mg via ORAL
  Filled 2023-08-01 (×2): qty 1

## 2023-08-01 MED ORDER — SUGAMMADEX SODIUM 200 MG/2ML IV SOLN
INTRAVENOUS | Status: DC | PRN
  Administered 2023-08-01: 200 mg via INTRAVENOUS

## 2023-08-01 MED ORDER — GABAPENTIN 300 MG PO CAPS
600.0000 mg | ORAL_CAPSULE | Freq: Every day | ORAL | Status: DC
  Administered 2023-08-01: 600 mg via ORAL
  Filled 2023-08-01: qty 2

## 2023-08-01 MED ORDER — KETOROLAC TROMETHAMINE 30 MG/ML IJ SOLN
INTRAMUSCULAR | Status: DC | PRN
  Administered 2023-08-01: 15 mg via INTRAVENOUS

## 2023-08-01 MED ORDER — GABAPENTIN 300 MG PO CAPS
300.0000 mg | ORAL_CAPSULE | Freq: Two times a day (BID) | ORAL | Status: DC
  Administered 2023-08-01 – 2023-08-02 (×2): 300 mg via ORAL
  Filled 2023-08-01 (×2): qty 1

## 2023-08-01 MED ORDER — METHOCARBAMOL 500 MG PO TABS: 500.0000 mg | ORAL_TABLET | Freq: Four times a day (QID) | ORAL | Status: DC | PRN

## 2023-08-01 MED ORDER — EPHEDRINE SULFATE-NACL 50-0.9 MG/10ML-% IV SOSY
PREFILLED_SYRINGE | INTRAVENOUS | Status: DC | PRN
  Administered 2023-08-01 (×2): 10 mg via INTRAVENOUS
  Administered 2023-08-01: 5 mg via INTRAVENOUS

## 2023-08-01 MED ORDER — GABAPENTIN 300 MG PO CAPS
300.0000 mg | ORAL_CAPSULE | ORAL | Status: DC
Start: 2023-08-01 — End: 2023-08-01

## 2023-08-01 MED ORDER — MAGNESIUM OXIDE -MG SUPPLEMENT 400 (240 MG) MG PO TABS
400.0000 mg | ORAL_TABLET | Freq: Every day | ORAL | Status: DC
  Administered 2023-08-01: 400 mg via ORAL
  Filled 2023-08-01: qty 1

## 2023-08-01 MED ORDER — HYDROMORPHONE HCL 1 MG/ML IJ SOLN: 0.2500 mg | INTRAMUSCULAR | Status: DC | PRN

## 2023-08-01 MED ORDER — SODIUM CHLORIDE 0.9% FLUSH: 3.0000 mL | INTRAVENOUS | Status: DC | PRN

## 2023-08-01 MED ORDER — KETOROLAC TROMETHAMINE 15 MG/ML IJ SOLN
7.5000 mg | Freq: Four times a day (QID) | INTRAMUSCULAR | Status: AC
  Administered 2023-08-01 – 2023-08-02 (×4): 7.5 mg via INTRAVENOUS
  Filled 2023-08-01 (×4): qty 1

## 2023-08-01 MED ORDER — DEXAMETHASONE SODIUM PHOSPHATE 10 MG/ML IJ SOLN
INTRAMUSCULAR | Status: DC | PRN
  Administered 2023-08-01: 5 mg via INTRAVENOUS

## 2023-08-01 MED ORDER — LORAZEPAM 0.5 MG PO TABS
0.5000 mg | ORAL_TABLET | Freq: Every evening | ORAL | Status: DC | PRN
  Administered 2023-08-01: 0.5 mg via ORAL
  Filled 2023-08-01: qty 1

## 2023-08-01 MED ORDER — KETOROLAC TROMETHAMINE 30 MG/ML IJ SOLN
INTRAMUSCULAR | Status: AC
  Filled 2023-08-01: qty 1

## 2023-08-01 MED ORDER — MIDAZOLAM HCL 2 MG/2ML IJ SOLN: 0.5000 mg | Freq: Once | INTRAMUSCULAR | Status: DC | PRN

## 2023-08-01 MED ORDER — HYDROCODONE-ACETAMINOPHEN 10-325 MG PO TABS: 1.0000 | ORAL_TABLET | ORAL | Status: DC | PRN

## 2023-08-01 MED ORDER — VANCOMYCIN HCL IN DEXTROSE 1-5 GM/200ML-% IV SOLN
1000.0000 mg | INTRAVENOUS | Status: AC
  Administered 2023-08-01: 1000 mg via INTRAVENOUS
  Filled 2023-08-01: qty 200

## 2023-08-01 MED ORDER — OXYCODONE HCL 5 MG PO TABS: 5.0000 mg | ORAL_TABLET | Freq: Once | ORAL | Status: DC | PRN

## 2023-08-01 MED ORDER — LEVOTHYROXINE SODIUM 25 MCG PO TABS: 50.0000 ug | ORAL_TABLET | ORAL | Status: DC

## 2023-08-01 MED ORDER — ONDANSETRON HCL 4 MG PO TABS: 4.0000 mg | ORAL_TABLET | Freq: Four times a day (QID) | ORAL | Status: DC | PRN

## 2023-08-01 MED ORDER — MENTHOL 3 MG MT LOZG: 1.0000 | LOZENGE | OROMUCOSAL | Status: DC | PRN

## 2023-08-01 MED ORDER — SODIUM CHLORIDE 0.9% FLUSH
3.0000 mL | Freq: Two times a day (BID) | INTRAVENOUS | Status: DC
Start: 2023-08-01 — End: 2023-08-02
  Administered 2023-08-01 (×2): 3 mL via INTRAVENOUS

## 2023-08-01 MED ORDER — HYDRALAZINE HCL 50 MG PO TABS
50.0000 mg | ORAL_TABLET | Freq: Three times a day (TID) | ORAL | Status: DC
  Administered 2023-08-01 – 2023-08-02 (×3): 50 mg via ORAL
  Filled 2023-08-01 (×3): qty 1

## 2023-08-01 MED ORDER — DOCUSATE SODIUM 100 MG PO CAPS: 100.0000 mg | ORAL_CAPSULE | Freq: Two times a day (BID) | ORAL | Status: DC | PRN

## 2023-08-01 MED ORDER — THROMBIN (RECOMBINANT) 5000 UNITS EX SOLR: CUTANEOUS | Status: DC | PRN

## 2023-08-01 MED ORDER — CALCIUM CARBONATE 1250 (500 CA) MG PO TABS
1250.0000 mg | ORAL_TABLET | Freq: Two times a day (BID) | ORAL | Status: DC
  Administered 2023-08-01 – 2023-08-02 (×2): 1250 mg via ORAL
  Filled 2023-08-01 (×2): qty 1

## 2023-08-01 MED ORDER — CHLORHEXIDINE GLUCONATE CLOTH 2 % EX PADS: 6.0000 | MEDICATED_PAD | Freq: Once | CUTANEOUS | Status: DC

## 2023-08-01 MED ORDER — HYDROMORPHONE HCL 1 MG/ML IJ SOLN: 1.0000 mg | INTRAMUSCULAR | Status: DC | PRN

## 2023-08-01 MED ORDER — THROMBIN 5000 UNITS EX KIT
PACK | CUTANEOUS | Status: AC
  Filled 2023-08-01: qty 2

## 2023-08-01 MED ORDER — ORAL CARE MOUTH RINSE: 15.0000 mL | Freq: Once | OROMUCOSAL | Status: AC

## 2023-08-01 MED ORDER — ACETAMINOPHEN 500 MG PO TABS
1000.0000 mg | ORAL_TABLET | Freq: Once | ORAL | Status: AC
  Administered 2023-08-01: 1000 mg via ORAL
  Filled 2023-08-01: qty 2

## 2023-08-01 MED ORDER — LACTATED RINGERS IV SOLN
INTRAVENOUS | Status: DC
Start: 2023-08-01 — End: 2023-08-01

## 2023-08-01 MED ORDER — FENTANYL CITRATE (PF) 250 MCG/5ML IJ SOLN
INTRAMUSCULAR | Status: AC
  Filled 2023-08-01: qty 5

## 2023-08-01 MED ORDER — ONDANSETRON HCL 4 MG/2ML IJ SOLN
INTRAMUSCULAR | Status: AC
  Filled 2023-08-01: qty 2

## 2023-08-01 MED ORDER — ACETAMINOPHEN-CODEINE 300-30 MG PO TABS
1.0000 | ORAL_TABLET | Freq: Four times a day (QID) | ORAL | Status: DC | PRN
  Administered 2023-08-01 – 2023-08-02 (×3): 1 via ORAL
  Filled 2023-08-01 (×3): qty 1

## 2023-08-01 MED ORDER — MEPERIDINE HCL 25 MG/ML IJ SOLN
6.2500 mg | INTRAMUSCULAR | Status: DC | PRN
Start: 2023-08-01 — End: 2023-08-01

## 2023-08-01 MED ORDER — VANCOMYCIN HCL IN DEXTROSE 1-5 GM/200ML-% IV SOLN
1000.0000 mg | Freq: Once | INTRAVENOUS | Status: AC
  Administered 2023-08-01: 1000 mg via INTRAVENOUS
  Filled 2023-08-01: qty 200

## 2023-08-01 MED ORDER — ONDANSETRON HCL 4 MG/2ML IJ SOLN
INTRAMUSCULAR | Status: DC | PRN
  Administered 2023-08-01: 4 mg via INTRAVENOUS

## 2023-08-01 MED ORDER — EPHEDRINE 5 MG/ML INJ
INTRAVENOUS | Status: AC
  Filled 2023-08-01: qty 5

## 2023-08-01 MED ORDER — PROPOFOL 10 MG/ML IV BOLUS
INTRAVENOUS | Status: AC
  Filled 2023-08-01: qty 20

## 2023-08-01 MED ORDER — ASPIRIN 81 MG PO TBEC
81.0000 mg | DELAYED_RELEASE_TABLET | Freq: Every day | ORAL | Status: DC
  Administered 2023-08-01 – 2023-08-02 (×2): 81 mg via ORAL
  Filled 2023-08-01 (×2): qty 1

## 2023-08-01 MED ORDER — LEVOTHYROXINE SODIUM 100 MCG PO TABS
100.0000 ug | ORAL_TABLET | Freq: Every day | ORAL | Status: DC
Start: 2023-08-02 — End: 2023-08-01

## 2023-08-01 MED ORDER — 0.9 % SODIUM CHLORIDE (POUR BTL) OPTIME
TOPICAL | Status: DC | PRN
  Administered 2023-08-01: 1000 mL

## 2023-08-01 MED ORDER — ACETAMINOPHEN 325 MG PO TABS: 650.0000 mg | ORAL_TABLET | ORAL | Status: DC | PRN

## 2023-08-01 MED ORDER — PROSIGHT PO TABS
1.0000 | ORAL_TABLET | Freq: Every day | ORAL | Status: DC
  Administered 2023-08-01: 1 via ORAL
  Filled 2023-08-01: qty 1

## 2023-08-01 MED ORDER — SODIUM CHLORIDE 0.9 % IV SOLN: INTRAVENOUS | Status: DC | PRN

## 2023-08-01 MED ORDER — SODIUM CHLORIDE 0.9 % IV SOLN
250.0000 mL | INTRAVENOUS | Status: DC
  Administered 2023-08-01: 250 mL via INTRAVENOUS

## 2023-08-01 MED ORDER — DILTIAZEM HCL ER COATED BEADS 240 MG PO CP24
240.0000 mg | ORAL_CAPSULE | Freq: Every day | ORAL | Status: DC
  Administered 2023-08-02: 240 mg via ORAL
  Filled 2023-08-01: qty 1

## 2023-08-01 MED ORDER — BUPIVACAINE HCL (PF) 0.25 % IJ SOLN
INTRAMUSCULAR | Status: DC | PRN
  Administered 2023-08-01: 20 mL

## 2023-08-01 MED ORDER — LIDOCAINE 2% (20 MG/ML) 5 ML SYRINGE
INTRAMUSCULAR | Status: AC
  Filled 2023-08-01: qty 5

## 2023-08-01 MED ORDER — LIDOCAINE 2% (20 MG/ML) 5 ML SYRINGE
INTRAMUSCULAR | Status: DC | PRN
  Administered 2023-08-01: 20 mg via INTRAVENOUS

## 2023-08-01 MED ORDER — DIPHENHYDRAMINE HCL 25 MG PO CAPS
25.0000 mg | ORAL_CAPSULE | Freq: Every day | ORAL | Status: DC
  Administered 2023-08-01: 25 mg via ORAL
  Filled 2023-08-01 (×2): qty 1

## 2023-08-01 MED ORDER — DEXAMETHASONE SODIUM PHOSPHATE 10 MG/ML IJ SOLN
INTRAMUSCULAR | Status: AC
  Filled 2023-08-01: qty 1

## 2023-08-01 MED ORDER — CHLORHEXIDINE GLUCONATE CLOTH 2 % EX PADS
6.0000 | MEDICATED_PAD | Freq: Once | CUTANEOUS | Status: DC
Start: 2023-08-01 — End: 2023-08-01

## 2023-08-01 MED ORDER — ACETAMINOPHEN 650 MG RE SUPP: 650.0000 mg | RECTAL | Status: DC | PRN

## 2023-08-01 MED ORDER — CHLORHEXIDINE GLUCONATE 0.12 % MT SOLN
15.0000 mL | Freq: Once | OROMUCOSAL | Status: AC
  Administered 2023-08-01: 15 mL via OROMUCOSAL
  Filled 2023-08-01: qty 15

## 2023-08-01 MED ORDER — CYCLOBENZAPRINE HCL 10 MG PO TABS: 10.0000 mg | ORAL_TABLET | Freq: Three times a day (TID) | ORAL | Status: DC | PRN

## 2023-08-01 MED ORDER — LEVOTHYROXINE SODIUM 100 MCG PO TABS
100.0000 ug | ORAL_TABLET | Freq: Every day | ORAL | Status: DC
  Administered 2023-08-02: 100 ug via ORAL
  Filled 2023-08-01: qty 1

## 2023-08-01 MED ORDER — PROPOFOL 10 MG/ML IV BOLUS
INTRAVENOUS | Status: DC | PRN
  Administered 2023-08-01: 80 mg via INTRAVENOUS

## 2023-08-01 MED ORDER — OXYCODONE HCL 5 MG/5ML PO SOLN: 5.0000 mg | Freq: Once | ORAL | Status: DC | PRN

## 2023-08-01 MED ORDER — FENTANYL CITRATE (PF) 250 MCG/5ML IJ SOLN
INTRAMUSCULAR | Status: DC | PRN
  Administered 2023-08-01 (×4): 50 ug via INTRAVENOUS

## 2023-08-01 MED ORDER — PHENOL 1.4 % MT LIQD: 1.0000 | OROMUCOSAL | Status: DC | PRN

## 2023-08-01 MED ORDER — ONDANSETRON HCL 4 MG/2ML IJ SOLN: 4.0000 mg | Freq: Four times a day (QID) | INTRAMUSCULAR | Status: DC | PRN

## 2023-08-01 MED ORDER — PANTOPRAZOLE SODIUM 40 MG PO TBEC
40.0000 mg | DELAYED_RELEASE_TABLET | Freq: Every day | ORAL | Status: DC
  Administered 2023-08-02: 40 mg via ORAL
  Filled 2023-08-01: qty 1

## 2023-08-01 SURGICAL SUPPLY — 41 items
BAG COUNTER SPONGE SURGICOUNT (BAG) ×1 IMPLANT
BAND RUBBER #18 3X1/16 STRL (MISCELLANEOUS) ×2 IMPLANT
BENZOIN TINCTURE PRP APPL 2/3 (GAUZE/BANDAGES/DRESSINGS) ×1 IMPLANT
BLADE CLIPPER SURG (BLADE) IMPLANT
BUR CUTTER 7.0 ROUND (BURR) ×1 IMPLANT
CANISTER SUCT 3000ML PPV (MISCELLANEOUS) ×1 IMPLANT
DERMABOND ADVANCED .7 DNX12 (GAUZE/BANDAGES/DRESSINGS) ×1 IMPLANT
DERMABOND ADVANCED .7 DNX6 (GAUZE/BANDAGES/DRESSINGS) IMPLANT
DRAPE HALF SHEET 40X57 (DRAPES) IMPLANT
DRAPE LAPAROTOMY 100X72X124 (DRAPES) ×1 IMPLANT
DRAPE MICROSCOPE SLANT 54X150 (MISCELLANEOUS) ×1 IMPLANT
DRAPE SURG 17X23 STRL (DRAPES) ×2 IMPLANT
DRSG OPSITE POSTOP 4X6 (GAUZE/BANDAGES/DRESSINGS) IMPLANT
DURAPREP 26ML APPLICATOR (WOUND CARE) ×1 IMPLANT
ELECT REM PT RETURN 9FT ADLT (ELECTROSURGICAL) ×1 IMPLANT
ELECTRODE REM PT RTRN 9FT ADLT (ELECTROSURGICAL) ×1 IMPLANT
GAUZE 4X4 16PLY ~~LOC~~+RFID DBL (SPONGE) IMPLANT
GLOVE BIO SURGEON STRL SZ 6 (GLOVE) ×1 IMPLANT
GLOVE BIOGEL PI IND STRL 6.5 (GLOVE) ×1 IMPLANT
GLOVE ECLIPSE 9.0 STRL (GLOVE) ×1 IMPLANT
GLOVE EXAM NITRILE XL STR (GLOVE) IMPLANT
GOWN STRL REUS W/ TWL LRG LVL3 (GOWN DISPOSABLE) IMPLANT
GOWN STRL REUS W/ TWL XL LVL3 (GOWN DISPOSABLE) ×1 IMPLANT
GOWN STRL REUS W/TWL 2XL LVL3 (GOWN DISPOSABLE) IMPLANT
KIT BASIN OR (CUSTOM PROCEDURE TRAY) ×1 IMPLANT
KIT TURNOVER KIT B (KITS) ×1 IMPLANT
NDL HYPO 22X1.5 SAFETY MO (MISCELLANEOUS) ×1 IMPLANT
NDL SPNL 22GX3.5 QUINCKE BK (NEEDLE) IMPLANT
NEEDLE HYPO 22X1.5 SAFETY MO (MISCELLANEOUS) ×1 IMPLANT
NEEDLE SPNL 22GX3.5 QUINCKE BK (NEEDLE) IMPLANT
NS IRRIG 1000ML POUR BTL (IV SOLUTION) ×1 IMPLANT
PACK LAMINECTOMY NEURO (CUSTOM PROCEDURE TRAY) ×1 IMPLANT
PAD ARMBOARD POSITIONER FOAM (MISCELLANEOUS) ×3 IMPLANT
SPIKE FLUID TRANSFER (MISCELLANEOUS) ×1 IMPLANT
SPONGE SURGIFOAM ABS GEL SZ50 (HEMOSTASIS) ×1 IMPLANT
STRIP CLOSURE SKIN 1/2X4 (GAUZE/BANDAGES/DRESSINGS) ×1 IMPLANT
SUT VIC AB 2-0 CT1 18 (SUTURE) ×1 IMPLANT
SUT VIC AB 3-0 SH 8-18 (SUTURE) ×1 IMPLANT
TOWEL GREEN STERILE (TOWEL DISPOSABLE) ×1 IMPLANT
TOWEL GREEN STERILE FF (TOWEL DISPOSABLE) ×1 IMPLANT
WATER STERILE IRR 1000ML POUR (IV SOLUTION) ×1 IMPLANT

## 2023-08-01 NOTE — Op Note (Signed)
 Date of procedure: 08/01/2023  Date of dictation: Same  Service: Neurosurgery  Preoperative diagnosis: Lumbar stenosis with neurogenic claudication  Postoperative diagnosis: Same  Procedure Name: L4-5 decompressive laminectomy with foraminotomies  Surgeon:Stavroula Rohde A.Amie Cowens, M.D.  Asst. Surgeon: Danielle Dess, MD; Doran Durand, NP  Anesthesia: General  Indication: 83 year old female with back and bilateral lower extremity pain numbness and weakness consistent with severe neurogenic claudication.  Workup demonstrates evidence of critical lumbar stenosis at L4-5.  Patient presents now for decompressive surgery.  Operative note: After induction of anesthesia, patient positioned prone on the Wilson frame and properly padded.  Lumbar region prepped and draped sterilely.  Incision made overlying L4-5.  Dissection performed bilaterally.  Retractor placed.  X-ray taken.  Level confirmed.  Decompressive laminectomy was then performed using Leksell rongeurs, Kerrison rongeurs the high-speed drill to remove the entire lamina of L4, the medial aspect of the L4-5 facet joint complex, and the superior rim of the L5 lamina.  Ligament flavum elevated and resected.  Lateral gutters were undercut and foraminotomies were performed on the course the exiting L4 and L5 nerve roots bilaterally.  At this point a very thorough decompression been achieved.  There was no evidence of injury to the thecal sac or nerve roots.  Wound is then irrigated.  Gelfoam was placed topically for hemostasis.  Wounds then closed in layers with Vicryl sutures.  Steri-Strips and sterile dressing were applied.  No apparent complications.  Patient tolerated the procedure well and she returns to the recovery room postop.

## 2023-08-01 NOTE — Brief Op Note (Signed)
 08/01/2023  9:53 AM  PATIENT:  Kathryn [name]  83 y.o. female  PRE-OPERATIVE DIAGNOSIS:  Lumbar stenosis with neurogenic claudication  POST-OPERATIVE DIAGNOSIS:  Lumbar stenosis with neurogenic claudication  PROCEDURE:  Procedure(s): Laminectomy and Foraminotomy - Lumbar four-five (N/A)  SURGEON:  Surgeons and Role:    * Julio Sicks, MD - Primary    Barnett Abu, MD - Assisting  PHYSICIAN ASSISTANT:   ASSISTANTSMarland Mcalpine   ANESTHESIA:   general  EBL:  150 mL   BLOOD ADMINISTERED:none  DRAINS: none   LOCAL MEDICATIONS USED:  MARCAINE     SPECIMEN:  No Specimen  DISPOSITION OF SPECIMEN:  N/A  COUNTS:  YES  TOURNIQUET:  * No tourniquets in log *  DICTATION: .Dragon Dictation  PLAN OF CARE: Admit for overnight observation  PATIENT DISPOSITION:  PACU - hemodynamically stable.   Delay start of Pharmacological VTE agent (>24hrs) due to surgical blood loss or risk of bleeding: yes

## 2023-08-01 NOTE — Transfer of Care (Signed)
 Immediate Anesthesia Transfer of Care Note  Patient: Kathryn [name]  Procedure(s) Performed: Laminectomy and Foraminotomy - Lumbar four-five (Spine Lumbar)  Patient Location: PACU  Anesthesia Type:General  Level of Consciousness: drowsy and patient cooperative  Airway & Oxygen Therapy: Patient connected to face mask oxygen  Post-op Assessment: Report given to RN, Post -op Vital signs reviewed and stable, and Patient moving all extremities X 4  Post vital signs: Reviewed and stable  Last Vitals:  Vitals Value Taken Time  BP 165/69 08/01/23 1006  Temp 36.4 C 08/01/23 1005  Pulse 45 08/01/23 1009  Resp 12 08/01/23 1009  SpO2 99 % 08/01/23 1009  Vitals shown include unfiled device data.  Last Pain:  Vitals:   08/01/23 0649  TempSrc:   PainSc: 0-No pain      Patients Stated Pain Goal: 0 (08/01/23 8657)  Complications: There were no known notable events for this encounter.

## 2023-08-01 NOTE — Anesthesia Procedure Notes (Signed)
 Procedure Name: Intubation Date/Time: 08/01/2023 8:22 AM  Performed by: Sharyn Dross, CRNAPre-anesthesia Checklist: Patient identified, Emergency Drugs available, Suction available and Patient being monitored Patient Re-evaluated:Patient Re-evaluated prior to induction Oxygen Delivery Method: Circle system utilized Preoxygenation: Pre-oxygenation with 100% oxygen Induction Type: IV induction Ventilation: Mask ventilation without difficulty Laryngoscope Size: Mac and 3 Grade View: Grade I Tube type: Oral Tube size: 7.0 mm Number of attempts: 1 Airway Equipment and Method: Stylet and Oral airway Placement Confirmation: ETT inserted through vocal cords under direct vision, positive ETCO2 and breath sounds checked- equal and bilateral Secured at: 22 cm Tube secured with: Tape Dental Injury: Teeth and Oropharynx as per pre-operative assessment

## 2023-08-01 NOTE — H&P (Signed)
 Kathryn [name] is an 83 y.o. female.   Chief Complaint: Back pain HPI: 83 year old female with back pain with radiation in the posterior aspects of both lower extremities worsened with standing and ambulation.  Workup demonstrates evidence of critical lumbar stenosis at L4-5.  She has moderately severe stenosis at L3-4 and moderate stenosis at L2-3.  Patient presents now for L4-5 decompression in hopes of improving her symptoms.  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   PONV (postoperative nausea and vomiting)    Pre-diabetes    HGBa1c high    Past Surgical History:  Procedure Laterality Date   CARPAL TUNNEL RELEASE Bilateral    CATARACT EXTRACTION Bilateral    COLON RESECTION  2012   12 inches   right knee meniscal tear surgery      THYROIDECTOMY  12/03/2013   TONSILLECTOMY     As a child   TOTAL HIP ARTHROPLASTY Right 07/01/2015   Procedure: RIGHT TOTAL HIP ARTHROPLASTY ANTERIOR APPROACH;  Surgeon: Ollen Gross, MD;  Location: [facility] ORS;  Service: Orthopedics;  Laterality: Right;   TOTAL KNEE ARTHROPLASTY Right 11/30/2020   Procedure: TOTAL KNEE ARTHROPLASTY;  Surgeon: Ollen Gross, MD;  Location: [facility] ORS;  Service: Orthopedics;  Laterality: Right;   TUBAL LIGATION      History reviewed. No pertinent family history. Social History:  reports that she has never smoked. She has never used smokeless tobacco. She reports that she does not currently use alcohol. She reports that she does not use drugs.  Allergies:  Allergies  Allergen Reactions   Cephalexin     Nausea, vomiting    Medications Prior to Admission  Medication Sig Dispense Refill   Ascorbic Acid (VITAMIN C) 1000 MG tablet Take 1,000 mg by mouth daily.     aspirin EC 81 MG tablet  Take 81 mg by mouth daily. Swallow whole.     calcium carbonate (OSCAL) 1500 (600 Ca) MG TABS tablet Take 1,500 mg by mouth 2 (two) times daily with a meal.     diltiazem (CARDIZEM CD) 120 MG 24 hr capsule Take 240 capsules by mouth daily.     diphenhydrAMINE (BENADRYL) 25 MG tablet Take 25 mg by mouth at bedtime.     docusate sodium (COLACE) 100 MG capsule Take 100 mg by mouth 2 (two) times daily as needed for mild constipation or moderate constipation.     gabapentin (NEURONTIN) 300 MG capsule Take 300-600 mg by mouth See admin instructions. Take 1 capsule (300 mg) by mouth twice daily & take 2 capsules (600 mg) by mouth at bedtime     hydrALAZINE (APRESOLINE) 50 MG tablet Take 1 tablet (50 mg total) by mouth 3 (three) times daily. 90 tablet 2   levothyroxine (SYNTHROID) 100 MCG tablet Take 100-150 mcg by mouth daily before breakfast. Take 1 tablet ( ) daily. Take an additional 1/2 tablet ( ) on Sundays to =125mcg.     LORazepam (ATIVAN) 0.5 MG tablet Take 0.5 mg by mouth at bedtime as needed for anxiety.     magnesium oxide (MAG-OX) 400 (240 Mg) MG tablet Take 400 mg by mouth daily.     Methylcellulose, Laxative, (FIBER THERAPY) 500 MG TABS Take 500 mg by  mouth 2 (two) times daily as needed.     Multiple Vitamins-Minerals (PRESERVISION AREDS 2 PO) Take 1 capsule by mouth daily.     omeprazole (PRILOSEC) 20 MG capsule Take 20 mg by mouth daily.     ondansetron (ZOFRAN-ODT) 4 MG disintegrating tablet Take 4 mg by mouth every 8 (eight) hours as needed for nausea or vomiting.     sotalol (BETAPACE) 80 MG tablet Take 80 mg by mouth 2 (two) times daily.      No results found for this or any previous visit (from the past 48 hours). No results found.  Pertinent items noted in HPI and remainder of comprehensive ROS otherwise negative.  Blood pressure (!) 180/86, pulse (!) 58, temperature 98.1 F (36.7 C), temperature source Oral, resp. rate 18, height 5\' 2"  (1.575 m), weight 54.4 kg, SpO2  98%.  Patient is awake and alert.  She is oriented and appropriate.  Speech is fluent.  Judgment insight are intact.  Cranial nerve function normal bilateral.  Motor examination reveals some mild weakness of dorsiflexion bilaterally otherwise motor strength intact.  Sensory examination with decrease sensation to pinprick and light touch from L5 distally.  Deep tendon rhexis hypoactive but symmetric.  No evidence of long track signs.  Summation head ears eyes nose throat is unremarkable chest and abdomen are benign.  Extremities are free from injury deformity. Assessment/Plan L4-5 stenosis with neurogenic claudication.  Plan L4-5 decompressive laminectomy with foraminotomies.  Risks and benefits been explained.  Patient wishes to proceed.  Kathryn [name] 08/01/2023, 7:58 AM

## 2023-08-01 NOTE — Anesthesia Postprocedure Evaluation (Signed)
 Anesthesia Post Note  Patient: Kathryn [name]  Procedure(s) Performed: Laminectomy and Foraminotomy - Lumbar four-five (Spine Lumbar)     Patient location during evaluation: PACU Anesthesia Type: General Level of consciousness: awake and alert, patient cooperative and oriented Pain management: pain level controlled Vital Signs Assessment: post-procedure vital signs reviewed and stable Respiratory status: spontaneous breathing, nonlabored ventilation and respiratory function stable Cardiovascular status: blood pressure returned to baseline and stable Postop Assessment: no apparent nausea or vomiting Anesthetic complications: no   There were no known notable events for this encounter.  Last Vitals:  Vitals:   08/01/23 1100 08/01/23 1123  BP: (!) 162/70 (!) 165/65  Pulse: (!) 46 (!) 45  Resp: 17 18  Temp: 36.5 C 36.5 C  SpO2: 95% 97%               Derrica Sieg,E. Letasha Kershaw

## 2023-08-01 NOTE — Plan of Care (Signed)

## 2023-08-02 ENCOUNTER — Encounter (HOSPITAL_COMMUNITY): Payer: Self-pay | Admitting: Neurosurgery

## 2023-08-02 DIAGNOSIS — M48062 Spinal stenosis, lumbar region with neurogenic claudication: Secondary | ICD-10-CM | POA: Diagnosis not present

## 2023-08-02 MED ORDER — ACETAMINOPHEN-CODEINE 300-30 MG PO TABS: 1.0000 | ORAL_TABLET | ORAL | 0 refills | Status: AC | PRN

## 2023-08-02 MED ORDER — DILTIAZEM HCL ER COATED BEADS 120 MG PO CP24: 240.0000 mg | ORAL_CAPSULE | Freq: Every day | ORAL | 0 refills | Status: AC

## 2023-08-02 MED ORDER — METHOCARBAMOL 500 MG PO TABS: 500.0000 mg | ORAL_TABLET | Freq: Four times a day (QID) | ORAL | 0 refills | Status: AC | PRN

## 2023-08-02 MED FILL — Thrombin For Soln 5000 Unit: CUTANEOUS | Qty: 2 | Status: AC

## 2023-08-02 NOTE — Discharge Instructions (Signed)

## 2023-08-02 NOTE — Evaluation (Signed)
 Occupational Therapy Evaluation and Discharge Patient Details Name: GENNETTE SHADIX MRN: [id number] DOB: 1940/11/09 Today's Date: 08/02/2023   History of Present Illness   Pt is an 83 yo female s/p L4-5 decompressive laminectomy with foraminotomies. 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 Currently she is Mod I with ADLs and will need A for IADLs (dtr and son). No further OT needs, we will sign off.     If plan is discharge home, recommend the following:   Assistance with cooking/housework;Assist for transportation     Functional Status Assessment   Patient has had a recent decline in their functional status and demonstrates the ability to make significant improvements in function in a reasonable and predictable amount of time. (without further skilled OT)     Equipment Recommendations   None recommended by OT      Precautions/Restrictions   Precautions Precautions: Back Precaution Booklet Issued: Yes (comment) Recall of Precautions/Restrictions: Intact Required Braces or Orthoses:  (no brace needed per chart) Restrictions Weight Bearing Restrictions Per Provider Order: No     Mobility Bed Mobility Overal bed mobility: Modified Independent             General bed mobility comments: HOB up    Transfers Overall transfer level: Modified independent Equipment used: Rolling walker (2 wheels)                      Balance Overall balance assessment: Mild deficits observed, not formally tested                                         ADL either performed or assessed with clinical judgement   ADL Overall ADL's : Modified independent                                       General ADL Comments: Educated on body mechanics for  LBD and LBB, use of 2 cups to brush teeth, use of wet wipes for back peri care, positioning with pillows in bed, building up sitting tolerance, getting in and OOB     Vision Baseline Vision/History: 1 Wears glasses Ability to See in Adequate Light: 0 Adequate Patient Visual Report: No change from baseline              Pertinent Vitals/Pain Pain Assessment Pain Assessment: 0-10 Pain Score: 2  Pain Location: incisional Pain Descriptors / Indicators: Sore Pain Intervention(s): Limited activity within patient's tolerance, Monitored during session     Extremity/Trunk Assessment Upper Extremity Assessment Upper Extremity Assessment: Right hand dominant;RUE deficits/detail RUE Deficits / Details: Decreased AROM at mid range of shoulder RUE Coordination: decreased gross motor           Communication Communication Communication: Impaired Factors Affecting Communication: Hearing impaired   Cognition Arousal: Alert Behavior During Therapy: WFL for tasks assessed/performed Cognition: No apparent impairments                               Following commands: Intact       Cueing  Cueing Techniques: Verbal cues              Home Living Family/patient expects to be discharged to:: Private residence Living Arrangements: Alone Available Help at Discharge: Family;Available PRN/intermittently Type of Home: House Home Access: Stairs to enter Entrance Stairs-Number of Steps: 1 Entrance Stairs-Rails: None Home Layout: One level     Bathroom Shower/Tub: Producer, television/film/video: Handicapped height     Home Equipment: Agricultural consultant (2 wheels);Cane - single point;Shower seat - built in;Grab bars - toilet;Grab bars - tub/shower;Hand held shower head;Other (comment)   Additional Comments: dtr staying for a week, adjustable bed      Prior Functioning/Environment Prior Level of Function : Independent/Modified Independent;Driving                     OT Problem List: Decreased range of motion;Pain        OT Goals(Current goals can be found in the care plan section)   Acute Rehab OT Goals Patient Stated Goal: to go home today         AM-PAC OT "6 Clicks" Daily Activity     Outcome Measure Help from another person eating meals?: None Help from another person taking care of personal grooming?: None Help from another person toileting, which includes using toliet, bedpan, or urinal?: None Help from another person bathing (including washing, rinsing, drying)?: None Help from another person to put on and taking off regular upper body clothing?: None Help from another person to put on and taking off regular lower body clothing?: None 6 Click Score: 24   End of Session Equipment Utilized During Treatment: Rolling walker (2 wheels) Nurse Communication:  (no further OT needs)  Activity Tolerance: Patient tolerated treatment well Patient left: in chair  OT Visit Diagnosis: Unsteadiness on feet (R26.81);Pain Pain - part of body:  (incisional back)                Time: 1610-9604 OT Time Calculation (min): 25 min Charges:  OT General Charges $OT Visit: 1 Visit OT Evaluation $OT Eval Moderate Complexity: 1 Mod OT Treatments $Self Care/Home Management : 8-22 mins  Lindon Romp OT Acute Rehabilitation Services Office [phone]     Evette Georges 08/02/2023, 9:07 AM

## 2023-08-02 NOTE — Evaluation (Signed)
 Physical Therapy Evaluation  Patient Details Name: Kathryn [name] MRN: [id number] DOB: 1940-09-15 Today's Date: 08/02/2023  History of Present Illness  Pt is an 83 yo female s/p L4-5 decompressive laminectomy with foraminotomies. 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 At the time of PT eval, pt was able to demonstrate transfers and ambulation with gross CGA to supervision for safety and RW for support. Pt was educated on precautions, positioning recommendations, appropriate activity progression, and car transfer. Pt currently with functional limitations due to the deficits listed below (see PT Problem List). Pt will benefit from skilled PT to increase their independence and safety with mobility to allow discharge to the venue listed below.          If plan is discharge home, recommend the following: A little help with walking and/or transfers;A little help with bathing/dressing/bathroom;Assistance with cooking/housework;Assist for transportation;Help with stairs or ramp for entrance   Can travel by private vehicle        Equipment Recommendations None recommended by PT  Recommendations for Other Services       Functional Status Assessment Patient has had a recent decline in their functional status and demonstrates the ability to make significant improvements in function in a reasonable and predictable amount of time.     Precautions / Restrictions Precautions Precautions: Back Precaution Booklet Issued: Yes (comment) Recall of Precautions/Restrictions: Intact Precaution/Restrictions Comments: Reviewed handout and pt was cued for precautions during functional mobility. Required Braces or Orthoses:  (no brace needed per orders) Restrictions Weight Bearing Restrictions Per Provider Order: No      Mobility  Bed Mobility               General bed mobility comments: Pt received  sitting up in the recliner.    Transfers Overall transfer level: Needs assistance Equipment used: Rolling walker (2 wheels) Transfers: Sit to/from Stand Sit to Stand: Supervision           General transfer comment: VC's for upright posture during sit<>stand. No assist required.    Ambulation/Gait Ambulation/Gait assistance: Supervision, Contact guard assist Gait Distance (Feet): 500 Feet Assistive device: Rolling walker (2 wheels) Gait Pattern/deviations: Step-through pattern, Decreased stride length, Trunk flexed, Knee flexed in stance - right, Knee flexed in stance - left Gait velocity: Decreased Gait velocity interpretation: <1.8 ft/sec, indicate of risk for recurrent falls   General Gait Details: VC's for improved posture, closer walker proximity and forward gaze. Pt with difficulty with heel strike and achieving knee extension during gait cycle.  Stairs            Wheelchair Mobility     Tilt Bed    Modified Rankin (Stroke Patients Only)       Balance Overall balance assessment: Mild deficits observed, not formally tested                                           Pertinent Vitals/Pain Pain Assessment Pain Assessment: Faces Faces Pain Scale: Hurts a little bit Pain Location: incisional Pain Descriptors / Indicators: Sore Pain Intervention(s): Limited activity within patient's tolerance, Monitored during session, Repositioned    Home Living Family/patient expects to be discharged to:: Private residence Living Arrangements: Alone Available Help at Discharge: Family;Available PRN/intermittently Type of Home: House Home Access: Stairs to enter Entrance  Stairs-Rails: None Entrance Stairs-Number of Steps: 1   Home Layout: One level Home Equipment: Agricultural consultant (2 wheels);Cane - single point;Shower seat - built in;Grab bars - toilet;Grab bars - tub/shower;Hand held shower head;Other (comment) Additional Comments: dtr staying for a  week, then son will be available; adjustable bed    Prior Function Prior Level of Function : Independent/Modified Independent;Driving                     Extremity/Trunk Assessment   Upper Extremity Assessment Upper Extremity Assessment: Defer to OT evaluation RUE Deficits / Details: Decreased AROM at mid range of shoulder RUE Coordination: decreased gross motor    Lower Extremity Assessment Lower Extremity Assessment: Generalized weakness;RLE deficits/detail;LLE deficits/detail RLE Deficits / Details: Decreased strength and gross sensation. Pt states she has had a knee replacement and hip replacement on this side. Pt with difficulty maintaining knee extension in standing LLE Deficits / Details: Decreased strength and sensation    Cervical / Trunk Assessment Cervical / Trunk Assessment: Back Surgery  Communication   Communication Communication: Impaired Factors Affecting Communication: Hearing impaired    Cognition Arousal: Alert Behavior During Therapy: WFL for tasks assessed/performed   PT - Cognitive impairments: No apparent impairments                         Following commands: Intact       Cueing Cueing Techniques: Verbal cues     General Comments      Exercises     Assessment/Plan    PT Assessment Patient needs continued PT services  PT Problem List Decreased strength;Decreased activity tolerance;Decreased balance;Decreased mobility;Decreased knowledge of use of DME;Decreased safety awareness;Decreased knowledge of precautions;Pain       PT Treatment Interventions DME instruction;Gait training;Stair training;Functional mobility training;Therapeutic activities;Therapeutic exercise;Balance training;Patient/family education    PT Goals (Current goals can be found in the Care Plan section)  Acute Rehab PT Goals Patient Stated Goal: Home today PT Goal Formulation: With patient Time For Goal Achievement: 08/09/23 Potential to Achieve  Goals: Good    Frequency Min 5X/week     Co-evaluation               AM-PAC PT "6 Clicks" Mobility  Outcome Measure Help needed turning from your back to your side while in a flat bed without using bedrails?: A Little Help needed moving from lying on your back to sitting on the side of a flat bed without using bedrails?: A Little Help needed moving to and from a bed to a chair (including a wheelchair)?: A Little Help needed standing up from a chair using your arms (e.g., wheelchair or bedside chair)?: A Little Help needed to walk in hospital room?: A Little Help needed climbing 3-5 steps with a railing? : A Little 6 Click Score: 18    End of Session Equipment Utilized During Treatment: Gait belt Activity Tolerance: Patient tolerated treatment well Patient left: in chair Nurse Communication: Mobility status PT Visit Diagnosis: Unsteadiness on feet (R26.81);Pain Pain - part of body:  (back)    Time: 1610-9604 PT Time Calculation (min) (ACUTE ONLY): 13 min   Charges:   PT Evaluation $PT Eval Low Complexity: 1 Low   PT General Charges $$ ACUTE PT VISIT: 1 Visit         Conni Slipper, PT, DPT Acute Rehabilitation Services Secure Chat Preferred Office: [phone]   Marylynn Pearson 08/02/2023, 9:50 AM

## 2023-08-02 NOTE — Discharge Summary (Signed)
 Physician Discharge Summary  Patient ID: Kathryn [name] MRN: [id number] DOB/AGE: 1940-09-16 83 y.o.  Admit date: 08/01/2023 Discharge date: 08/02/2023  Admission Diagnoses:  Discharge Diagnoses:  Principal Problem:   Lumbar stenosis with neurogenic claudication   Discharged Condition: good  Hospital Course: Patient admitted to the hospital where she underwent uncomplicated L4-5 decompression surgery.  Postoperatively she is doing very well.  Standing ambulating voiding without difficulty.  Preoperative symptoms much improved.  Patient ready for discharge home.  Consults:   Significant Diagnostic Studies:   Treatments:   Discharge Exam: Blood pressure (!) 143/73, pulse 73, temperature 98.2 F (36.8 C), temperature source Oral, resp. rate 18, height 5\' 2"  (1.575 m), weight 54.4 kg, SpO2 99%. Awake and alert.  Oriented and appropriate.  Motor and sensory function intact.  Wound clean and dry.  Chest and abdomen benign.  Disposition:    Allergies as of 08/02/2023       Reactions   Cephalexin    Nausea, vomiting     Med Rec must be completed prior to using this SMART[employer]***        Signed: [name] Adryen Cookson 08/02/2023, 8:28 AM

## 2023-08-02 NOTE — Plan of Care (Signed)
  Problem: Education: Goal: Knowledge of General Education information will improve Description: Including pain rating scale, medication(s)/side effects and non-pharmacologic comfort measures 08/02/2023 1052 by Vincente Liberty, RN Outcome: Completed/Met 08/02/2023 1052 by Vincente Liberty, RN Outcome: Completed/Met   Problem: Health Behavior/Discharge Planning: Goal: Ability to manage health-related needs will improve 08/02/2023 1052 by Vincente Liberty, RN Outcome: Completed/Met 08/02/2023 1052 by Vincente Liberty, RN Outcome: Completed/Met   Problem: Clinical Measurements: Goal: Ability to maintain clinical measurements within normal limits will improve 08/02/2023 1052 by Vincente Liberty, RN Outcome: Completed/Met 08/02/2023 1052 by Vincente Liberty, RN Outcome: Completed/Met Goal: Will remain free from infection 08/02/2023 1052 by Vincente Liberty, RN Outcome: Completed/Met 08/02/2023 1052 by Vincente Liberty, RN Outcome: Completed/Met Goal: Diagnostic test results will improve 08/02/2023 1052 by Vincente Liberty, RN Outcome: Completed/Met 08/02/2023 1052 by Vincente Liberty, RN Outcome: Completed/Met Goal: Respiratory complications will improve 08/02/2023 1052 by Vincente Liberty, RN Outcome: Completed/Met 08/02/2023 1052 by Vincente Liberty, RN Outcome: Completed/Met Goal: Cardiovascular complication will be avoided 08/02/2023 1052 by Vincente Liberty, RN Outcome: Completed/Met 08/02/2023 1052 by Vincente Liberty, RN Outcome: Completed/Met   Problem: Activity: Goal: Risk for activity intolerance will decrease 08/02/2023 1052 by Vincente Liberty, RN Outcome: Completed/Met 08/02/2023 1052 by Vincente Liberty, RN Outcome: Completed/Met   Problem: Nutrition: Goal: Adequate nutrition will be maintained 08/02/2023 1052 by Vincente Liberty, RN Outcome: Completed/Met 08/02/2023 1052 by Vincente Liberty, RN Outcome: Completed/Met   Problem: Coping: Goal: Level of anxiety will decrease 08/02/2023  1052 by Vincente Liberty, RN Outcome: Completed/Met 08/02/2023 1052 by Vincente Liberty, RN Outcome: Completed/Met   Problem: Elimination: Goal: Will not experience complications related to bowel motility 08/02/2023 1052 by Vincente Liberty, RN Outcome: Completed/Met 08/02/2023 1052 by Vincente Liberty, RN Outcome: Completed/Met Goal: Will not experience complications related to urinary retention 08/02/2023 1052 by Vincente Liberty, RN Outcome: Completed/Met 08/02/2023 1052 by Vincente Liberty, RN Outcome: Completed/Met   Problem: Pain Managment: Goal: General experience of comfort will improve and/or be controlled 08/02/2023 1052 by Vincente Liberty, RN Outcome: Completed/Met 08/02/2023 1052 by Vincente Liberty, RN Outcome: Completed/Met   Problem: Safety: Goal: Ability to remain free from injury will improve 08/02/2023 1052 by Vincente Liberty, RN Outcome: Completed/Met 08/02/2023 1052 by Vincente Liberty, RN Outcome: Completed/Met   Problem: Skin Integrity: Goal: Risk for impaired skin integrity will decrease 08/02/2023 1052 by Vincente Liberty, RN Outcome: Completed/Met 08/02/2023 1052 by Vincente Liberty, RN Outcome: Completed/Met

## 2023-08-02 NOTE — Progress Notes (Signed)
 Patient awaiting family for discharge home, Patient in no acute distress nor complaints of pain nor discomfort; incision on back is clean, dry and intact; No c/o pain at this time. Room was checked and accounted for all patient's belongings; discharge instructions concerning her medications, incision care, follow up appointment and when to call the doctor as needed were all discussed with patient by RN and she expressed understanding on the instructions given.
